# Patient Record
Sex: Female | Born: 1985 | Race: Black or African American | Hispanic: No | Marital: Single | Smoking: Former smoker
Health system: Southern US, Community
[De-identification: ages and names within clinical notes are randomized; demographics above are authoritative.]

## PROBLEM LIST (undated history)

## (undated) ENCOUNTER — Inpatient Hospital Stay (HOSPITAL_COMMUNITY): Payer: Self-pay

## (undated) DIAGNOSIS — Z789 Other specified health status: Secondary | ICD-10-CM

## (undated) HISTORY — PX: NO PAST SURGERIES: SHX2092

---
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
## 2005-06-11 ENCOUNTER — Emergency Department (HOSPITAL_COMMUNITY): Admission: EM | Admit: 2005-06-11 | Discharge: 2005-06-11 | Payer: Self-pay | Admitting: Emergency Medicine

## 2010-05-27 ENCOUNTER — Inpatient Hospital Stay (INDEPENDENT_AMBULATORY_CARE_PROVIDER_SITE_OTHER)
Admission: RE | Admit: 2010-05-27 | Discharge: 2010-05-27 | Disposition: A | Discharge status: Home / Self Care | Payer: Self-pay | Source: Ambulatory Visit | Attending: Family Medicine | Admitting: Family Medicine

## 2010-05-27 DIAGNOSIS — K625 Hemorrhage of anus and rectum: Secondary | ICD-10-CM

## 2011-03-31 ENCOUNTER — Other Ambulatory Visit: Payer: Self-pay

## 2011-03-31 LAB — OB RESULTS CONSOLE HIV ANTIBODY (ROUTINE TESTING): HIV: NONREACTIVE

## 2011-03-31 LAB — OB RESULTS CONSOLE HEPATITIS B SURFACE ANTIGEN: Hepatitis B Surface Ag: NEGATIVE

## 2011-03-31 LAB — OB RESULTS CONSOLE RPR: RPR: NONREACTIVE

## 2011-06-25 LAB — OB RESULTS CONSOLE GC/CHLAMYDIA: Chlamydia: NEGATIVE

## 2011-07-15 ENCOUNTER — Other Ambulatory Visit: Payer: Self-pay | Admitting: Nurse Practitioner

## 2011-07-15 DIAGNOSIS — O09299 Supervision of pregnancy with other poor reproductive or obstetric history, unspecified trimester: Secondary | ICD-10-CM

## 2011-07-23 ENCOUNTER — Ambulatory Visit (HOSPITAL_COMMUNITY)
Admission: RE | Admit: 2011-07-23 | Discharge: 2011-07-23 | Disposition: A | Discharge status: Home / Self Care | Payer: Medicaid Other | Source: Ambulatory Visit | Attending: Nurse Practitioner | Admitting: Nurse Practitioner

## 2011-07-23 ENCOUNTER — Other Ambulatory Visit: Payer: Self-pay | Admitting: Nurse Practitioner

## 2011-07-23 ENCOUNTER — Other Ambulatory Visit (HOSPITAL_COMMUNITY): Payer: Self-pay | Admitting: Maternal and Fetal Medicine

## 2011-07-23 ENCOUNTER — Encounter (HOSPITAL_COMMUNITY): Payer: Self-pay

## 2011-07-23 DIAGNOSIS — O09299 Supervision of pregnancy with other poor reproductive or obstetric history, unspecified trimester: Secondary | ICD-10-CM | POA: Insufficient documentation

## 2011-07-23 DIAGNOSIS — O26849 Uterine size-date discrepancy, unspecified trimester: Secondary | ICD-10-CM

## 2011-07-23 DIAGNOSIS — O365990 Maternal care for other known or suspected poor fetal growth, unspecified trimester, not applicable or unspecified: Secondary | ICD-10-CM | POA: Insufficient documentation

## 2011-07-23 DIAGNOSIS — IMO0002 Reserved for concepts with insufficient information to code with codable children: Secondary | ICD-10-CM

## 2011-07-23 DIAGNOSIS — O358XX0 Maternal care for other (suspected) fetal abnormality and damage, not applicable or unspecified: Secondary | ICD-10-CM | POA: Insufficient documentation

## 2011-07-23 DIAGNOSIS — Z363 Encounter for antenatal screening for malformations: Secondary | ICD-10-CM | POA: Insufficient documentation

## 2011-07-23 DIAGNOSIS — Z1389 Encounter for screening for other disorder: Secondary | ICD-10-CM | POA: Insufficient documentation

## 2011-07-23 NOTE — Progress Notes (Signed)
MFM note  Shari Hardin is a 26 yo G2P0100 currently at 73 5/7 weeks who is seen for detailed ultrasound and consultation due to history of twin IUFD at 33 weeks.  She reports that her last pregnancy was with ? Dichorionic/diamniotic twins that had been uncomplicated.  At 33 weeks, she felt decreased fetal movement and was seen on L&D and diagnosed with an intrauterine fetal demise of both fetuses.  She did not undergo autopsy and placental pathology was unavailable for review at the time of consultation.  The cause of the twin demise is unknown.  Her current pregnancy has otherwise been uncomplicated.  PMH - neg  PSH - neg  Meds: Claritin             Prenatal vitamins  Social - patient is a non drinker and non smoker, denies illicit drug use  Family hx: father with Diabetes, hypertension; mother with hypertension  Ultrasound:  Single IUP at 77 5/7 weeks Normal anatomic fetal survey The estimated fetal weight today is at the 23rd percentile; however the [facility] and [facility] are at the 3rd and 5th percentile respectively Normal umbilical artery Doppler studies Normal amniotic fluid volume  Recommend weekly Dopplers / BPPs Follow up in 3 weeks for interval growth NSTs beginning at 32 weeks.  Impression/ Plan: 1) Hx of 33 week twin demise - unknown etiology.  Would consider induction of labor at 39 weeks if delivery is not indicated prior to that gestation. 2) AC measuring < 3rd percentile - overall fetal growth appropriate.  Will begin weekly umbilical artery Dopplers/ BPPs - plan NSTs beginning at 32 weeks and interval growth scans every 3-4 weeks.  Thank you for the opportunity to be a part of the care of Shari Hardin. Please contact our office if we can be of further assistance.   I spent approximately 30 minutes with this patient with over 50% of time spent in face-to-face counseling.

## 2011-07-29 ENCOUNTER — Encounter (HOSPITAL_COMMUNITY): Payer: Self-pay

## 2011-07-29 ENCOUNTER — Ambulatory Visit (HOSPITAL_COMMUNITY)
Admission: RE | Admit: 2011-07-29 | Discharge: 2011-07-29 | Disposition: A | Discharge status: Home / Self Care | Payer: Medicaid Other | Source: Ambulatory Visit | Attending: Nurse Practitioner | Admitting: Nurse Practitioner

## 2011-07-29 ENCOUNTER — Other Ambulatory Visit (HOSPITAL_COMMUNITY): Payer: Self-pay | Admitting: Maternal and Fetal Medicine

## 2011-07-29 VITALS — BP 103/63 | HR 108 | Wt 175.0 lb

## 2011-07-29 DIAGNOSIS — O09299 Supervision of pregnancy with other poor reproductive or obstetric history, unspecified trimester: Secondary | ICD-10-CM | POA: Insufficient documentation

## 2011-07-29 DIAGNOSIS — IMO0002 Reserved for concepts with insufficient information to code with codable children: Secondary | ICD-10-CM

## 2011-07-29 DIAGNOSIS — O365990 Maternal care for other known or suspected poor fetal growth, unspecified trimester, not applicable or unspecified: Secondary | ICD-10-CM | POA: Insufficient documentation

## 2011-07-29 NOTE — Progress Notes (Signed)
Patient seen for follow up BPP and Doppler studies.  See report in [facility].  Alpha Gula, MD  Single IUP at 30 4/7 weeks The fetus is active with a BPP of 8/8 Normal umbilical artery Doppler studies  Recommend weekly Dopplers / BPPs Follow up in 2 weeks for interval growth NSTs beginning at 32 weeks.

## 2011-08-05 ENCOUNTER — Encounter (HOSPITAL_COMMUNITY): Payer: Self-pay

## 2011-08-05 ENCOUNTER — Ambulatory Visit (HOSPITAL_COMMUNITY)
Admission: RE | Admit: 2011-08-05 | Discharge: 2011-08-05 | Disposition: A | Discharge status: Home / Self Care | Payer: Medicaid Other | Source: Ambulatory Visit | Attending: Nurse Practitioner | Admitting: Nurse Practitioner

## 2011-08-05 VITALS — BP 98/60 | HR 97 | Wt 174.0 lb

## 2011-08-05 DIAGNOSIS — O09299 Supervision of pregnancy with other poor reproductive or obstetric history, unspecified trimester: Secondary | ICD-10-CM | POA: Insufficient documentation

## 2011-08-05 DIAGNOSIS — IMO0002 Reserved for concepts with insufficient information to code with codable children: Secondary | ICD-10-CM

## 2011-08-05 DIAGNOSIS — O365990 Maternal care for other known or suspected poor fetal growth, unspecified trimester, not applicable or unspecified: Secondary | ICD-10-CM | POA: Insufficient documentation

## 2011-08-05 NOTE — Progress Notes (Signed)
Patient seen for follow up BPP and Doppler studies.  See report in [facility].  Alpha Gula, MD  Single IUP at 30 4/7 weeks The fetus is active with a BPP of 8/8 Normal umbilical artery Doppler studies without evidence of AEDF or reversed diastolic flow Normal amniotic fluid volume  Recommend weekly Dopplers / BPPs Follow up next week for interval growth NSTs beginning at 32 weeks.

## 2011-08-12 ENCOUNTER — Ambulatory Visit (HOSPITAL_COMMUNITY)
Admission: RE | Admit: 2011-08-12 | Discharge: 2011-08-12 | Disposition: A | Discharge status: Home / Self Care | Payer: Medicaid Other | Source: Ambulatory Visit | Attending: Nurse Practitioner | Admitting: Nurse Practitioner

## 2011-08-12 ENCOUNTER — Encounter (HOSPITAL_COMMUNITY): Payer: Self-pay

## 2011-08-12 VITALS — BP 101/55 | HR 90 | Wt 176.0 lb

## 2011-08-12 DIAGNOSIS — O09299 Supervision of pregnancy with other poor reproductive or obstetric history, unspecified trimester: Secondary | ICD-10-CM | POA: Insufficient documentation

## 2011-08-12 DIAGNOSIS — IMO0002 Reserved for concepts with insufficient information to code with codable children: Secondary | ICD-10-CM

## 2011-08-12 DIAGNOSIS — O365990 Maternal care for other known or suspected poor fetal growth, unspecified trimester, not applicable or unspecified: Secondary | ICD-10-CM | POA: Insufficient documentation

## 2011-08-12 NOTE — Progress Notes (Signed)
Patient seen today  for follow up ultrasound.  See full report in [facility].  Alpha Gula, MD  Single IUP at 32 4/7 weeks Normal anatomic fetal survey The estimated fetal weight today is at the 29th percentile; the  [facility] is now measuring at the 14th percentile The umbilical artery S/D ratios are elevated but there is no evidence of absent or reversed diastolic flow Normal amniotic fluid volume  Recommend continued weekly BPP / Doppler studies at this office.  Also recommend beginning weekly NSTs. Follow up growth scan in 4 weeks.

## 2011-08-13 ENCOUNTER — Other Ambulatory Visit (HOSPITAL_COMMUNITY): Payer: Self-pay | Admitting: Maternal and Fetal Medicine

## 2011-08-13 DIAGNOSIS — IMO0002 Reserved for concepts with insufficient information to code with codable children: Secondary | ICD-10-CM

## 2011-08-17 ENCOUNTER — Encounter (HOSPITAL_COMMUNITY): Payer: Self-pay

## 2011-08-17 ENCOUNTER — Ambulatory Visit (HOSPITAL_COMMUNITY)
Admission: RE | Admit: 2011-08-17 | Discharge: 2011-08-17 | Disposition: A | Discharge status: Home / Self Care | Payer: Medicaid Other | Source: Ambulatory Visit | Attending: Nurse Practitioner | Admitting: Nurse Practitioner

## 2011-08-17 ENCOUNTER — Other Ambulatory Visit (HOSPITAL_COMMUNITY): Payer: Self-pay | Admitting: Maternal and Fetal Medicine

## 2011-08-17 VITALS — BP 108/53 | HR 103 | Wt 176.5 lb

## 2011-08-17 DIAGNOSIS — IMO0002 Reserved for concepts with insufficient information to code with codable children: Secondary | ICD-10-CM

## 2011-08-17 DIAGNOSIS — O365990 Maternal care for other known or suspected poor fetal growth, unspecified trimester, not applicable or unspecified: Secondary | ICD-10-CM | POA: Insufficient documentation

## 2011-08-17 DIAGNOSIS — O09299 Supervision of pregnancy with other poor reproductive or obstetric history, unspecified trimester: Secondary | ICD-10-CM | POA: Insufficient documentation

## 2011-08-17 NOTE — Progress Notes (Signed)
Patient seen for follow up Doppler studies.  See report in [facility].  Alpha Gula, MD  Single IUP at 33 2/7 weeks Normal amniotic fluid volume Umbilical artery Dopplers were within normal limits without evidence of AEDF or reversed flow.  Recommend continued weekly BPP / Doppler studies at this office.  Also recommend beginning weekly NSTs. Follow up growth scan in 4 weeks.

## 2011-08-24 ENCOUNTER — Ambulatory Visit (HOSPITAL_COMMUNITY)
Admission: RE | Admit: 2011-08-24 | Discharge: 2011-08-24 | Disposition: A | Discharge status: Home / Self Care | Payer: Medicaid Other | Source: Ambulatory Visit | Attending: Nurse Practitioner | Admitting: Nurse Practitioner

## 2011-08-24 VITALS — BP 103/57 | HR 98 | Wt 177.2 lb

## 2011-08-24 DIAGNOSIS — O09299 Supervision of pregnancy with other poor reproductive or obstetric history, unspecified trimester: Secondary | ICD-10-CM | POA: Insufficient documentation

## 2011-08-24 DIAGNOSIS — O365990 Maternal care for other known or suspected poor fetal growth, unspecified trimester, not applicable or unspecified: Secondary | ICD-10-CM | POA: Insufficient documentation

## 2011-08-24 DIAGNOSIS — IMO0002 Reserved for concepts with insufficient information to code with codable children: Secondary | ICD-10-CM

## 2011-08-27 LAB — OB RESULTS CONSOLE GBS: GBS: NEGATIVE

## 2011-08-31 ENCOUNTER — Other Ambulatory Visit (HOSPITAL_COMMUNITY): Payer: Self-pay | Admitting: Maternal and Fetal Medicine

## 2011-08-31 ENCOUNTER — Encounter (HOSPITAL_COMMUNITY): Payer: Self-pay

## 2011-08-31 ENCOUNTER — Ambulatory Visit (HOSPITAL_COMMUNITY)
Admission: RE | Admit: 2011-08-31 | Discharge: 2011-08-31 | Disposition: A | Discharge status: Home / Self Care | Payer: Medicaid Other | Source: Ambulatory Visit | Attending: Nurse Practitioner | Admitting: Nurse Practitioner

## 2011-08-31 VITALS — BP 102/59 | HR 93 | Wt 177.0 lb

## 2011-08-31 DIAGNOSIS — IMO0002 Reserved for concepts with insufficient information to code with codable children: Secondary | ICD-10-CM

## 2011-08-31 DIAGNOSIS — O365990 Maternal care for other known or suspected poor fetal growth, unspecified trimester, not applicable or unspecified: Secondary | ICD-10-CM | POA: Insufficient documentation

## 2011-08-31 DIAGNOSIS — O09299 Supervision of pregnancy with other poor reproductive or obstetric history, unspecified trimester: Secondary | ICD-10-CM | POA: Insufficient documentation

## 2011-08-31 NOTE — Progress Notes (Signed)
Patient seen today  for follow up ultrasound.  See full report in [facility].  Alpha Gula, MD  Single IUP at 35 2/7 weeks The estimated fetal weight today is at the 20th percentile; the [facility] is now measuring at the 7th percentile (asymmetric growth pattern) Normal umbilical artery Doppler studies without evidence of AEDF or reversed flow Normal amniotic fluid volume The fetus is active with a BPP of 8/8  Recommend continued weekly BPP / Doppler studies at this office.  Also recommend beginning weekly NSTs. Would recommend delivery at 39 weeks if undelivered.

## 2011-09-02 ENCOUNTER — Inpatient Hospital Stay (HOSPITAL_COMMUNITY)
Admission: AD | Admit: 2011-09-02 | Discharge: 2011-09-02 | Disposition: A | Discharge status: Home / Self Care | Payer: Medicaid Other | Source: Ambulatory Visit | Attending: Obstetrics & Gynecology | Admitting: Obstetrics & Gynecology

## 2011-09-02 ENCOUNTER — Encounter (HOSPITAL_COMMUNITY): Payer: Self-pay | Admitting: *Deleted

## 2011-09-02 DIAGNOSIS — O4700 False labor before 37 completed weeks of gestation, unspecified trimester: Secondary | ICD-10-CM | POA: Insufficient documentation

## 2011-09-02 DIAGNOSIS — O368190 Decreased fetal movements, unspecified trimester, not applicable or unspecified: Secondary | ICD-10-CM | POA: Insufficient documentation

## 2011-09-02 HISTORY — DX: Other specified health status: Z78.9

## 2011-09-02 NOTE — MAU Note (Signed)
Pt states she took a nap and did not feel like she was feeling the baby move,pt states this lasted for about 3 hours

## 2011-09-02 NOTE — MAU Note (Signed)
Patient stats she has been feeling some fetal movement today but not as much as usual. Denies any bleeding or leaking and states she has had one contraction. Fetal heart tones in triage in the 140's with an audible acceleration.

## 2011-09-02 NOTE — MAU Provider Note (Signed)
  History     CSN: [id number]  Arrival date and time: 09/02/11 1715   None     Chief Complaint  Patient presents with  . Decreased Fetal Movement   HPI Shari Hardin 25 y.o. [redacted]w[redacted]d Client comes to MAU today due to not feeling the baby move.  Is high risk due to loss of twins at 33 weeks.  Was in the office today and then took a nap.  Was not feeling the baby move so came to MAU for evaluation.  Next appointment is Monday.  Having irregular contractions.  OB History    Grav Para Term Preterm Abortions TAB SAB Ect Mult Living   2 1 0 1 0 0 0 0 1 0       Past Medical History  Diagnosis Date  . No pertinent past medical history     Past Surgical History  Procedure Date  . No past surgeries     Family History  Problem Relation Age of Onset  . Hypertension Mother   . Diabetes Father   . Hypertension Father     History  Substance Use Topics  . Smoking status: Former Smoker    Quit date: 02/02/2011  . Smokeless tobacco: Not on file  . Alcohol Use: No    Allergies: No Known Allergies  Prescriptions prior to admission  Medication Sig Dispense Refill  . loratadine (CLARITIN) 10 MG tablet Take 10 mg by mouth daily. Pt has not taken yet.  Has not picked up prescription. pollin allergy      . Prenatal Vit-Fe Fumarate-FA (PRENATAL MULTIVITAMIN) TABS Take 1 tablet by mouth daily.        Review of Systems  Gastrointestinal: Negative for nausea, vomiting and abdominal pain.  Genitourinary: Negative for dysuria.       Decreased fetal movement   Physical Exam   Blood pressure 107/59, pulse 77, temperature 98.1 F (36.7 C), temperature source Oral, resp. rate 16, height 5\' 5"  (1.651 m), weight 178 lb 3.2 oz (80.831 kg), last menstrual period 12/27/2010, SpO2 100.00%.  Physical Exam  Nursing note and vitals reviewed. Constitutional: She is oriented to person, place, and time. She appears well-developed and well-nourished.  HENT:  Head: Normocephalic.  Eyes: EOM are  normal.  Neck: Neck supple.  GI: Soft. There is no tenderness.       FHT baseline 150.  Reactive strip.   Irregular contractions.  Cervix closed - checked by RN.   Client now feeling movement.  Musculoskeletal: Normal range of motion.  Neurological: She is alert and oriented to person, place, and time.  Skin: Skin is warm and dry.  Psychiatric: She has a normal mood and affect.    MAU Course  Procedures  MDM Reactive NST and baby now moving.  Client wants to go home.  Assessment and Plan  Decreased fetal movement was client's initial complaint Currently baby is moving - NST Reactive  Plan Drink at least 8 8-oz glasses of water every day. Keep you appointments as scheduled Call your doctor if you have questions or concerns.  [name] 09/02/2011, 8:38 PM

## 2011-09-07 ENCOUNTER — Ambulatory Visit (HOSPITAL_COMMUNITY)
Admission: RE | Admit: 2011-09-07 | Discharge: 2011-09-07 | Disposition: A | Discharge status: Home / Self Care | Payer: Medicaid Other | Source: Ambulatory Visit | Attending: Nurse Practitioner | Admitting: Nurse Practitioner

## 2011-09-07 ENCOUNTER — Encounter (HOSPITAL_COMMUNITY): Payer: Self-pay

## 2011-09-07 VITALS — BP 100/56 | HR 88 | Wt 179.0 lb

## 2011-09-07 DIAGNOSIS — O09299 Supervision of pregnancy with other poor reproductive or obstetric history, unspecified trimester: Secondary | ICD-10-CM | POA: Insufficient documentation

## 2011-09-07 DIAGNOSIS — O365990 Maternal care for other known or suspected poor fetal growth, unspecified trimester, not applicable or unspecified: Secondary | ICD-10-CM | POA: Insufficient documentation

## 2011-09-07 DIAGNOSIS — IMO0002 Reserved for concepts with insufficient information to code with codable children: Secondary | ICD-10-CM

## 2011-09-07 NOTE — Progress Notes (Signed)
Patient seen for follow up BPP and Doppler studies.  See report in [facility].  Alpha Gula, MD  Single IUP at 36 2/7 weeks Asymmetric growth pattern on previous ultrasound (AC at the 7th %tile, overall growth at the 20th %tile) Normal umbilical artery Doppler studies without evidence of AEDF or reversed flow Normal amniotic fluid volume The fetus is active with a BPP of 8/8  Recommend continued weekly BPP / Doppler studies at this office.  Also recommend beginning weekly NSTs. Would recommend delivery by 39 weeks

## 2011-09-17 ENCOUNTER — Ambulatory Visit (HOSPITAL_COMMUNITY)
Admission: RE | Admit: 2011-09-17 | Discharge: 2011-09-17 | Disposition: A | Discharge status: Home / Self Care | Payer: Medicaid Other | Source: Ambulatory Visit | Attending: Nurse Practitioner | Admitting: Nurse Practitioner

## 2011-09-17 VITALS — BP 103/63 | HR 88 | Wt 180.0 lb

## 2011-09-17 DIAGNOSIS — IMO0002 Reserved for concepts with insufficient information to code with codable children: Secondary | ICD-10-CM

## 2011-09-17 DIAGNOSIS — O09299 Supervision of pregnancy with other poor reproductive or obstetric history, unspecified trimester: Secondary | ICD-10-CM | POA: Insufficient documentation

## 2011-09-17 DIAGNOSIS — O365990 Maternal care for other known or suspected poor fetal growth, unspecified trimester, not applicable or unspecified: Secondary | ICD-10-CM | POA: Insufficient documentation

## 2011-09-23 ENCOUNTER — Other Ambulatory Visit: Payer: Self-pay | Admitting: Obstetrics

## 2011-09-24 ENCOUNTER — Encounter (HOSPITAL_COMMUNITY): Payer: Self-pay | Admitting: *Deleted

## 2011-09-24 ENCOUNTER — Telehealth (HOSPITAL_COMMUNITY): Payer: Self-pay | Admitting: *Deleted

## 2011-09-24 ENCOUNTER — Ambulatory Visit (HOSPITAL_COMMUNITY)
Admission: RE | Admit: 2011-09-24 | Discharge: 2011-09-24 | Disposition: A | Discharge status: Home / Self Care | Payer: Medicaid Other | Source: Ambulatory Visit | Attending: Nurse Practitioner | Admitting: Nurse Practitioner

## 2011-09-24 DIAGNOSIS — O09299 Supervision of pregnancy with other poor reproductive or obstetric history, unspecified trimester: Secondary | ICD-10-CM | POA: Insufficient documentation

## 2011-09-24 DIAGNOSIS — IMO0002 Reserved for concepts with insufficient information to code with codable children: Secondary | ICD-10-CM

## 2011-09-24 DIAGNOSIS — O365990 Maternal care for other known or suspected poor fetal growth, unspecified trimester, not applicable or unspecified: Secondary | ICD-10-CM | POA: Insufficient documentation

## 2011-09-24 NOTE — Telephone Encounter (Signed)
Preadmission screen  

## 2011-09-28 ENCOUNTER — Inpatient Hospital Stay (HOSPITAL_COMMUNITY)
Admission: RE | Admit: 2011-09-28 | Discharge: 2011-10-03 | DRG: 765 | Disposition: A | Discharge status: Home / Self Care | Payer: Medicaid Other | Source: Ambulatory Visit | Attending: Obstetrics | Admitting: Obstetrics

## 2011-09-28 ENCOUNTER — Encounter (HOSPITAL_COMMUNITY): Payer: Self-pay

## 2011-09-28 DIAGNOSIS — O324XX0 Maternal care for high head at term, not applicable or unspecified: Secondary | ICD-10-CM | POA: Diagnosis present

## 2011-09-28 DIAGNOSIS — Z98891 History of uterine scar from previous surgery: Secondary | ICD-10-CM | POA: Diagnosis not present

## 2011-09-28 DIAGNOSIS — O365990 Maternal care for other known or suspected poor fetal growth, unspecified trimester, not applicable or unspecified: Secondary | ICD-10-CM | POA: Diagnosis present

## 2011-09-28 LAB — CBC
Hemoglobin: 10.7 g/dL — ABNORMAL LOW (ref 12.0–15.0)
MCH: 35.3 pg — ABNORMAL HIGH (ref 26.0–34.0)
MCV: 102.3 fL — ABNORMAL HIGH (ref 78.0–100.0)
Platelets: 191 10*3/uL (ref 150–400)
RBC: 3.03 MIL/uL — ABNORMAL LOW (ref 3.87–5.11)
WBC: 15 10*3/uL — ABNORMAL HIGH (ref 4.0–10.5)

## 2011-09-28 MED ORDER — MISOPROSTOL 25 MCG QUARTER TABLET
25.0000 ug | ORAL_TABLET | ORAL | Status: DC | PRN
  Administered 2011-09-28 – 2011-09-29 (×3): 25 ug via VAGINAL
  Filled 2011-09-28 (×3): qty 0.25

## 2011-09-28 MED ORDER — LACTATED RINGERS IV SOLN
500.0000 mL | INTRAVENOUS | Status: DC | PRN
  Administered 2011-09-30 (×2): 500 mL via INTRAVENOUS

## 2011-09-28 MED ORDER — HYDROXYZINE HCL 50 MG PO TABS: 50.0000 mg | ORAL_TABLET | Freq: Four times a day (QID) | ORAL | Status: DC | PRN

## 2011-09-28 MED ORDER — BUTORPHANOL TARTRATE 1 MG/ML IJ SOLN
1.0000 mg | INTRAMUSCULAR | Status: DC | PRN
  Administered 2011-09-29 (×3): 1 mg via INTRAVENOUS
  Filled 2011-09-28 (×3): qty 1

## 2011-09-28 MED ORDER — IBUPROFEN 600 MG PO TABS: 600.0000 mg | ORAL_TABLET | Freq: Four times a day (QID) | ORAL | Status: DC | PRN

## 2011-09-28 MED ORDER — OXYTOCIN 40 UNITS IN LACTATED RINGERS INFUSION - SIMPLE MED: 62.5000 mL/h | Freq: Once | INTRAVENOUS | Status: DC

## 2011-09-28 MED ORDER — OXYTOCIN BOLUS FROM INFUSION
250.0000 mL | Freq: Once | INTRAVENOUS | Status: DC
  Filled 2011-09-28: qty 500

## 2011-09-28 MED ORDER — ACETAMINOPHEN 325 MG PO TABS: 650.0000 mg | ORAL_TABLET | ORAL | Status: DC | PRN

## 2011-09-28 MED ORDER — CITRIC ACID-SODIUM CITRATE 334-500 MG/5ML PO SOLN
30.0000 mL | ORAL | Status: DC | PRN
  Administered 2011-09-30: 30 mL via ORAL
  Filled 2011-09-28: qty 15

## 2011-09-28 MED ORDER — BUTORPHANOL TARTRATE 1 MG/ML IJ SOLN: 1.0000 mg | INTRAMUSCULAR | Status: DC | PRN

## 2011-09-28 MED ORDER — TERBUTALINE SULFATE 1 MG/ML IJ SOLN: 0.2500 mg | Freq: Once | INTRAMUSCULAR | Status: AC | PRN

## 2011-09-28 MED ORDER — LACTATED RINGERS IV SOLN
INTRAVENOUS | Status: DC
  Administered 2011-09-28 – 2011-09-30 (×5): via INTRAVENOUS

## 2011-09-28 MED ORDER — LIDOCAINE HCL (PF) 1 % IJ SOLN: 30.0000 mL | INTRAMUSCULAR | Status: DC | PRN

## 2011-09-28 MED ORDER — ONDANSETRON HCL 4 MG/2ML IJ SOLN
4.0000 mg | Freq: Four times a day (QID) | INTRAMUSCULAR | Status: DC | PRN
  Administered 2011-09-30: 4 mg via INTRAVENOUS
  Filled 2011-09-28: qty 2

## 2011-09-28 MED ORDER — OXYCODONE-ACETAMINOPHEN 5-325 MG PO TABS: 1.0000 | ORAL_TABLET | ORAL | Status: DC | PRN

## 2011-09-28 MED ORDER — HYDROXYZINE HCL 50 MG/ML IM SOLN: 50.0000 mg | Freq: Four times a day (QID) | INTRAMUSCULAR | Status: DC | PRN

## 2011-09-28 MED ORDER — PROMETHAZINE HCL 25 MG/ML IJ SOLN
12.5000 mg | INTRAMUSCULAR | Status: DC | PRN
  Administered 2011-09-29 (×2): 12.5 mg via INTRAVENOUS
  Filled 2011-09-28 (×2): qty 1

## 2011-09-29 ENCOUNTER — Encounter (HOSPITAL_COMMUNITY): Payer: Self-pay

## 2011-09-29 MED ORDER — EPHEDRINE 5 MG/ML INJ
10.0000 mg | INTRAVENOUS | Status: DC | PRN
  Filled 2011-09-29: qty 4

## 2011-09-29 MED ORDER — OXYTOCIN 40 UNITS IN LACTATED RINGERS INFUSION - SIMPLE MED
1.0000 m[IU]/min | INTRAVENOUS | Status: DC
  Administered 2011-09-29: 1 m[IU]/min via INTRAVENOUS
  Filled 2011-09-29: qty 1000

## 2011-09-29 MED ORDER — FENTANYL 2.5 MCG/ML BUPIVACAINE 1/10 % EPIDURAL INFUSION (WH - ANES)
14.0000 mL/h | INTRAMUSCULAR | Status: DC
  Administered 2011-09-30 (×4): 14 mL/h via EPIDURAL
  Filled 2011-09-29 (×5): qty 60

## 2011-09-29 MED ORDER — TERBUTALINE SULFATE 1 MG/ML IJ SOLN: 0.2500 mg | Freq: Once | INTRAMUSCULAR | Status: AC | PRN

## 2011-09-29 MED ORDER — PHENYLEPHRINE 40 MCG/ML (10ML) SYRINGE FOR IV PUSH (FOR BLOOD PRESSURE SUPPORT): 80.0000 ug | PREFILLED_SYRINGE | INTRAVENOUS | Status: DC | PRN

## 2011-09-29 MED ORDER — DIPHENHYDRAMINE HCL 50 MG/ML IJ SOLN: 12.5000 mg | INTRAMUSCULAR | Status: DC | PRN

## 2011-09-29 MED ORDER — EPHEDRINE 5 MG/ML INJ: 10.0000 mg | INTRAVENOUS | Status: DC | PRN

## 2011-09-29 MED ORDER — LACTATED RINGERS IV SOLN
500.0000 mL | Freq: Once | INTRAVENOUS | Status: AC
  Administered 2011-09-30: 500 mL via INTRAVENOUS

## 2011-09-29 MED ORDER — PHENYLEPHRINE 40 MCG/ML (10ML) SYRINGE FOR IV PUSH (FOR BLOOD PRESSURE SUPPORT)
80.0000 ug | PREFILLED_SYRINGE | INTRAVENOUS | Status: DC | PRN
  Filled 2011-09-29: qty 5

## 2011-09-29 NOTE — H&P (Signed)
Shari Hardin is a 26 y.o. female presenting for IOL. Maternal Medical History:  Reason for admission: 26 yo G2 P0   EDC 10-03-11.  Presents for IOL for asymmetric growth restriction with AC at 7th percentile.  Fetal activity: Perceived fetal activity is normal.   Last perceived fetal movement was within the past hour.    Prenatal complications: no prenatal complications   OB History    Grav Para Term Preterm Abortions TAB SAB Ect Mult Living   2 1 0 1 0 0 0 0 1 0      Past Medical History  Diagnosis Date  . No pertinent past medical history    Past Surgical History  Procedure Date  . No past surgeries    Family History: family history includes Diabetes in her father and Hypertension in her father and mother. Social History:  reports that she quit smoking about 7 months ago. She has never used smokeless tobacco. She reports that she does not drink alcohol or use illicit drugs.   Prenatal Transfer Tool  Maternal Diabetes: No Genetic Screening: Normal Maternal Ultrasounds/Referrals: Normal Fetal Ultrasounds or other Referrals:  Referred to Materal Fetal Medicine , Other: see prenatal record Maternal Substance Abuse:  No Significant Maternal Medications:  Meds include: Other: see prenatal record Significant Maternal Lab Results:  Lab values include: Group B Strep negative Other Comments:  Asymmetric IUGR.  Review of Systems  All other systems reviewed and are negative.    Dilation: 1 Effacement (%): 50 Station: -3 Exam by:: N Deal RN Blood pressure 116/72, pulse 77, temperature 97.8 F (36.6 C), temperature source Oral, resp. rate 16, height 5\' 5"  (1.651 m), weight 81.647 kg (180 lb), last menstrual period 12/27/2010. Maternal Exam:  Abdomen: Patient reports no abdominal tenderness. Introitus: Normal vulva. Normal vagina.    Physical Exam  Nursing note and vitals reviewed. Constitutional: She is oriented to person, place, and time. She appears well-developed and  well-nourished.  HENT:  Head: Normocephalic and atraumatic.  Eyes: Conjunctivae are normal. Pupils are equal, round, and reactive to light.  Neck: Normal range of motion. Neck supple.  Cardiovascular: Normal rate and regular rhythm.   Respiratory: Effort normal.  GI: Soft.  Genitourinary: Vagina normal and uterus normal.  Musculoskeletal: Normal range of motion.  Neurological: She is alert and oriented to person, place, and time.  Skin: Skin is warm and dry.  Psychiatric: She has a normal mood and affect. Her behavior is normal. Judgment and thought content normal.    Prenatal labs: ABO, Rh: A/Positive/-- (01/29 0000) Antibody: Negative (01/29 0000) Rubella: Immune (01/29 0000) RPR: NON REACTIVE (07/29 1010)  HBsAg: Negative (01/29 0000)  HIV: Non-reactive (01/29 0000)  GBS: Negative (06/27 0000)   Assessment/Plan: 39.3 weeks.  Asymmetric IUGR.  Admitted for IOL, per recommendation of MFM.   HARPER,CHARLES A 09/29/2011, 8:19 AM

## 2011-09-29 NOTE — Progress Notes (Signed)
Roshana B [place] is a 26 y.o. G2P0100 at [redacted]w[redacted]d by LMP admitted for induction of labor due to Poor fetal growth.  Subjective:   Objective: BP 116/72  Pulse 77  Temp 97.8 F (36.6 C) (Oral)  Resp 16  Ht 5\' 5"  (1.651 m)  Wt 81.647 kg (180 lb)  BMI 29.95 kg/m2  LMP 12/27/2010      FHT:  FHR: 150 bpm, variability: moderate,  accelerations:  Present,  decelerations:  Absent UC:   regular, every 6 minutes SVE:   Dilation: 1 Effacement (%): 50 Station: -3 Exam by:: N Deal RN  Labs: Lab Results  Component Value Date   WBC 15.0* 09/28/2011   HGB 10.7* 09/28/2011   HCT 31.0* 09/28/2011   MCV 102.3* 09/28/2011   PLT 191 09/28/2011    Assessment / Plan: 39.3 weeks.  Asymmetric IUGR.  2 stage IOL.  Labor: Latent phase. Preeclampsia:  n/a Fetal Wellbeing:  Category I Pain Control:  Nubain I/D:  n/a Anticipated MOD:  NSVD  HARPER,CHARLES A 09/29/2011, 8:28 AM

## 2011-09-29 NOTE — Progress Notes (Signed)
Sleeping through contractions

## 2011-09-30 ENCOUNTER — Encounter (HOSPITAL_COMMUNITY): Payer: Self-pay | Admitting: Anesthesiology

## 2011-09-30 ENCOUNTER — Inpatient Hospital Stay (HOSPITAL_COMMUNITY): Payer: Medicaid Other | Admitting: Anesthesiology

## 2011-09-30 ENCOUNTER — Encounter (HOSPITAL_COMMUNITY): Payer: Self-pay

## 2011-09-30 ENCOUNTER — Encounter (HOSPITAL_COMMUNITY)
Admission: RE | Disposition: A | Discharge status: Home / Self Care | Payer: Self-pay | Source: Ambulatory Visit | Attending: Obstetrics

## 2011-09-30 LAB — HEMOGLOBIN AND HEMATOCRIT, BLOOD
HCT: 24.5 % — ABNORMAL LOW (ref 36.0–46.0)
Hemoglobin: 8.4 g/dL — ABNORMAL LOW (ref 12.0–15.0)

## 2011-09-30 SURGERY — Surgical Case
Anesthesia: Regional | Site: Abdomen | Wound class: Clean Contaminated

## 2011-09-30 MED ORDER — PHENYLEPHRINE HCL 10 MG/ML IJ SOLN
INTRAMUSCULAR | Status: DC | PRN
  Administered 2011-09-30: 80 ug via INTRAVENOUS
  Administered 2011-09-30: 40 ug via INTRAVENOUS
  Administered 2011-09-30 (×3): 80 ug via INTRAVENOUS

## 2011-09-30 MED ORDER — MEPERIDINE HCL 25 MG/ML IJ SOLN: 6.2500 mg | INTRAMUSCULAR | Status: DC | PRN

## 2011-09-30 MED ORDER — MORPHINE SULFATE (PF) 0.5 MG/ML IJ SOLN
INTRAMUSCULAR | Status: DC | PRN
  Administered 2011-09-30: 3 ug via EPIDURAL

## 2011-09-30 MED ORDER — HYDROMORPHONE HCL PF 1 MG/ML IJ SOLN
0.2500 mg | INTRAMUSCULAR | Status: DC | PRN
  Administered 2011-09-30: 0.5 mg via INTRAVENOUS

## 2011-09-30 MED ORDER — SCOPOLAMINE 1 MG/3DAYS TD PT72
1.0000 | MEDICATED_PATCH | Freq: Once | TRANSDERMAL | Status: DC
  Administered 2011-09-30: 1.5 mg via TRANSDERMAL

## 2011-09-30 MED ORDER — PHENYLEPHRINE 40 MCG/ML (10ML) SYRINGE FOR IV PUSH (FOR BLOOD PRESSURE SUPPORT)
PREFILLED_SYRINGE | INTRAVENOUS | Status: AC
  Filled 2011-09-30: qty 5

## 2011-09-30 MED ORDER — LACTATED RINGERS IV SOLN
INTRAVENOUS | Status: DC | PRN
  Administered 2011-09-30 (×4): via INTRAVENOUS

## 2011-09-30 MED ORDER — CEFAZOLIN SODIUM-DEXTROSE 2-3 GM-% IV SOLR
INTRAVENOUS | Status: AC
  Filled 2011-09-30: qty 50

## 2011-09-30 MED ORDER — MORPHINE SULFATE 0.5 MG/ML IJ SOLN
INTRAMUSCULAR | Status: AC
  Filled 2011-09-30: qty 10

## 2011-09-30 MED ORDER — HYDROMORPHONE HCL PF 1 MG/ML IJ SOLN
INTRAMUSCULAR | Status: AC
  Filled 2011-09-30: qty 1

## 2011-09-30 MED ORDER — LACTATED RINGERS IV SOLN
INTRAVENOUS | Status: DC | PRN
  Administered 2011-09-30: 21:00:00 via INTRAVENOUS

## 2011-09-30 MED ORDER — FENTANYL 2.5 MCG/ML BUPIVACAINE 1/10 % EPIDURAL INFUSION (WH - ANES)
INTRAMUSCULAR | Status: DC | PRN
  Administered 2011-09-30: 14 mL/h via EPIDURAL

## 2011-09-30 MED ORDER — KETOROLAC TROMETHAMINE 60 MG/2ML IM SOLN
60.0000 mg | Freq: Once | INTRAMUSCULAR | Status: AC | PRN
  Administered 2011-09-30: 60 mg via INTRAMUSCULAR

## 2011-09-30 MED ORDER — KETOROLAC TROMETHAMINE 60 MG/2ML IM SOLN
INTRAMUSCULAR | Status: AC
  Filled 2011-09-30: qty 2

## 2011-09-30 MED ORDER — SODIUM BICARBONATE 8.4 % IV SOLN
INTRAVENOUS | Status: DC | PRN
  Administered 2011-09-30: 4 mL via EPIDURAL

## 2011-09-30 MED ORDER — SCOPOLAMINE 1 MG/3DAYS TD PT72
MEDICATED_PATCH | TRANSDERMAL | Status: AC
  Filled 2011-09-30: qty 1

## 2011-09-30 MED ORDER — CEFAZOLIN SODIUM-DEXTROSE 2-3 GM-% IV SOLR: 2.0000 g | INTRAVENOUS | Status: DC

## 2011-09-30 MED ORDER — OXYTOCIN 10 UNIT/ML IJ SOLN
INTRAMUSCULAR | Status: AC
  Filled 2011-09-30: qty 4

## 2011-09-30 MED ORDER — ONDANSETRON HCL 4 MG/2ML IJ SOLN
INTRAMUSCULAR | Status: DC | PRN
  Administered 2011-09-30: 4 mg via INTRAVENOUS

## 2011-09-30 MED ORDER — CEFAZOLIN SODIUM 1-5 GM-% IV SOLN
INTRAVENOUS | Status: DC | PRN
  Administered 2011-09-30: 2 g via INTRAVENOUS

## 2011-09-30 MED ORDER — LIDOCAINE-EPINEPHRINE (PF) 2 %-1:200000 IJ SOLN
INTRAMUSCULAR | Status: AC
  Filled 2011-09-30: qty 20

## 2011-09-30 MED ORDER — SODIUM BICARBONATE 8.4 % IV SOLN
INTRAVENOUS | Status: AC
  Filled 2011-09-30: qty 50

## 2011-09-30 MED ORDER — OXYTOCIN 10 UNIT/ML IJ SOLN
INTRAMUSCULAR | Status: DC | PRN
  Administered 2011-09-30: 40 [IU]

## 2011-09-30 MED ORDER — MORPHINE SULFATE (PF) 0.5 MG/ML IJ SOLN
INTRAMUSCULAR | Status: DC | PRN
  Administered 2011-09-30: 2 ug via INTRAVENOUS

## 2011-09-30 MED ORDER — ONDANSETRON HCL 4 MG/2ML IJ SOLN
INTRAMUSCULAR | Status: AC
  Filled 2011-09-30: qty 2

## 2011-09-30 SURGICAL SUPPLY — 40 items
BENZOIN TINCTURE PRP APPL 2/3 (GAUZE/BANDAGES/DRESSINGS) ×2 IMPLANT
CANISTER WOUND CARE 500ML ATS (WOUND CARE) IMPLANT
CHLORAPREP W/TINT 26ML (MISCELLANEOUS) ×2 IMPLANT
CLOTH BEACON ORANGE TIMEOUT ST (SAFETY) ×2 IMPLANT
CONTAINER PREFILL 10% NBF 15ML (MISCELLANEOUS) IMPLANT
DRESSING TELFA 8X3 (GAUZE/BANDAGES/DRESSINGS) ×2 IMPLANT
DRSG COVADERM 4X10 (GAUZE/BANDAGES/DRESSINGS) ×2 IMPLANT
DRSG VAC ATS LRG SENSATRAC (GAUZE/BANDAGES/DRESSINGS) IMPLANT
DRSG VAC ATS MED SENSATRAC (GAUZE/BANDAGES/DRESSINGS) IMPLANT
DRSG VAC ATS SM SENSATRAC (GAUZE/BANDAGES/DRESSINGS) IMPLANT
ELECT REM PT RETURN 9FT ADLT (ELECTROSURGICAL) ×2
ELECTRODE REM PT RTRN 9FT ADLT (ELECTROSURGICAL) ×1 IMPLANT
EXTRACTOR VACUUM M CUP 4 TUBE (SUCTIONS) IMPLANT
GAUZE SPONGE 4X4 12PLY STRL LF (GAUZE/BANDAGES/DRESSINGS) ×4 IMPLANT
GLOVE BIO SURGEON STRL SZ 6.5 (GLOVE) ×4 IMPLANT
GOWN PREVENTION PLUS LG XLONG (DISPOSABLE) ×6 IMPLANT
KIT ABG SYR 3ML LUER SLIP (SYRINGE) IMPLANT
NEEDLE HYPO 25X5/8 SAFETYGLIDE (NEEDLE) ×2 IMPLANT
NS IRRIG 1000ML POUR BTL (IV SOLUTION) ×2 IMPLANT
PACK C SECTION WH (CUSTOM PROCEDURE TRAY) ×2 IMPLANT
PAD ABD 7.5X8 STRL (GAUZE/BANDAGES/DRESSINGS) ×2 IMPLANT
RTRCTR C-SECT PINK 25CM LRG (MISCELLANEOUS) IMPLANT
RTRCTR C-SECT PINK 34CM XLRG (MISCELLANEOUS) IMPLANT
SLEEVE SCD COMPRESS KNEE MED (MISCELLANEOUS) IMPLANT
STAPLER VISISTAT 35W (STAPLE) IMPLANT
STRIP CLOSURE SKIN 1/2X4 (GAUZE/BANDAGES/DRESSINGS) ×2 IMPLANT
SUT MNCRL 0 VIOLET CTX 36 (SUTURE) ×2 IMPLANT
SUT MNCRL AB 3-0 PS2 27 (SUTURE) IMPLANT
SUT MONOCRYL 0 CTX 36 (SUTURE) ×2
SUT PDS AB 0 CTX 36 PDP370T (SUTURE) ×2 IMPLANT
SUT PLAIN 0 NONE (SUTURE) IMPLANT
SUT VIC AB 0 CTXB 36 (SUTURE) IMPLANT
SUT VIC AB 2-0 CT1 (SUTURE) IMPLANT
SUT VIC AB 2-0 CT1 27 (SUTURE) ×1
SUT VIC AB 2-0 CT1 TAPERPNT 27 (SUTURE) ×1 IMPLANT
SUT VIC AB 2-0 SH 27 (SUTURE)
SUT VIC AB 2-0 SH 27XBRD (SUTURE) IMPLANT
TOWEL OR 17X24 6PK STRL BLUE (TOWEL DISPOSABLE) ×4 IMPLANT
TRAY FOLEY CATH 14FR (SET/KITS/TRAYS/PACK) ×2 IMPLANT
WATER STERILE IRR 1000ML POUR (IV SOLUTION) ×2 IMPLANT

## 2011-09-30 NOTE — Anesthesia Procedure Notes (Signed)

## 2011-09-30 NOTE — Transfer of Care (Signed)
Immediate Anesthesia Transfer of Care Note  Patient: Shari Hardin  Procedure(s) Performed: Procedure(s) (LRB): CESAREAN SECTION (N/A)  Patient Location: PACU  Anesthesia Type: Epidural  Level of Consciousness: awake  Airway & Oxygen Therapy: Patient Spontanous Breathing  Post-op Assessment: Report given to PACU RN  Post vital signs: Reviewed and stable  Complications: No apparent anesthesia complications

## 2011-09-30 NOTE — Anesthesia Preprocedure Evaluation (Signed)

## 2011-09-30 NOTE — Progress Notes (Signed)
Sundae B [place] is a 26 y.o. G2P0100 at [redacted]w[redacted]d by LMP admitted for induction of labor due to Poor fetal growth.  Subjective: Pushing  Objective: BP 132/65  Pulse 76  Temp 99.8 F (37.7 C) (Oral)  Resp 18  Ht 5\' 5"  (1.651 m)  Wt 81.647 kg (180 lb)  BMI 29.95 kg/m2  SpO2 99%  LMP 12/27/2010 I/O last 3 completed shifts: In: -  Out: 450 [Urine:450] Total I/O In: -  Out: 500 [Urine:500]  FHT:  FHR: 160 bpm, variability: moderate,  accelerations:  Abscent,  decelerations:  Absent UC:   regular, every 2 minutes SVE:   Dilation: 10 Effacement (%): 100 Station: +2 (caput) Exam by:: Dr Tamela Oddi  Labs: Lab Results  Component Value Date   WBC 15.0* 09/28/2011   HGB 10.7* 09/28/2011   HCT 31.0* 09/28/2011   MCV 102.3* 09/28/2011   PLT 191 09/28/2011    Assessment / Plan: Stage II, slow descent  Labor: see above Preeclampsia:  n/a Fetal Wellbeing:  Category I Pain Control:  Epidural I/D:  n/a Monitor progress  JACKSON-MOORE,Mcdaniel Ohms A 09/30/2011, 8:04 PM

## 2011-09-30 NOTE — Op Note (Signed)
Cesarean Section Procedure Note   Shari Hardin   09/28/2011 - 09/30/2011  Indications: Arrest of descent   Pre-operative Diagnosis: Arrest of desent.   Post-operative Diagnosis: Same   Surgeon: Antionette Char A  Assistants: none  Anesthesia: epidural  Procedure Details:  The patient was seen on the Labor & Delivery Unit. The risks, benefits, complications, treatment options, and expected outcomes were discussed with the patient. The patient concurred with the proposed plan, giving informed consent. The patient was identified as [name] and the procedure verified as C-Section Delivery. A Time Out was held and the above information confirmed.  After induction of anesthesia, the patient was draped and prepped in the usual sterile manner. A transverse incision was made and carried down through the subcutaneous tissue to the fascia. The fascial incision was made and extended transversely. The fascia was separated from the underlying rectus tissue superiorly and inferiorly. The peritoneum was identified and entered. The peritoneal incision was extended longitudinally. The utero-vesical peritoneal reflection was incised transversely and the bladder flap was bluntly freed from the lower uterine segment. A low transverse uterine incision was made. Delivered from cephalic presentation was a  living newborn female infant. A cord ph was not sent. The umbilical cord was clamped and cut cord.  The placenta was removed Intact and appeared normal.  The uterine incision was closed with running locked sutures of 1-0 Monocryl. A second imbricating layer of the same suture was placed.  Hemostasis was observed. The paracolic gutters were irrigated. The fascia was then reapproximated with running sutures of 1-0Vicryl. The subcuticular closure was performed using 3-0 Monocryl.  Instrument, sponge, and needle counts were correct prior the abdominal closure and were correct at the conclusion of the case.     Findings:  See above   Estimated Blood Loss: 1000 ml  Total IV Fluids: per Anesthesiology  Urine Output: per Anesthesiology  Specimens: none   Complications: no complications  Disposition: PACU - hemodynamically stable.  Maternal Condition: stable   Baby condition / location:  nursery-stable    Signed: Surgeon(s): Antionette Char, MD

## 2011-09-30 NOTE — Anesthesia Postprocedure Evaluation (Signed)
  Anesthesia Post-op Note  Patient: Shari Hardin  Procedure(s) Performed: Procedure(s) (LRB): CESAREAN SECTION (N/A)   Patient is awake, responsive, moving her legs, and has signs of resolution of her numbness. Pain and nausea are reasonably well controlled. Vital signs are stable and clinically acceptable. Oxygen saturation is clinically acceptable. There are no apparent anesthetic complications at this time. Patient is ready for discharge.

## 2011-09-30 NOTE — Progress Notes (Signed)
Shari Hardin is a 26 y.o. G2P0100 at [redacted]w[redacted]d by LMP admitted for induction of labor due to Poor fetal growth.  Subjective:   Objective: BP 114/64  Pulse 73  Temp 98.1 F (36.7 C) (Oral)  Resp 16  Ht 5\' 5"  (1.651 m)  Wt 81.647 kg (180 lb)  BMI 29.95 kg/m2  SpO2 99%  LMP 12/27/2010 I/O last 3 completed shifts: In: -  Out: 450 [Urine:450]    FHT:  FHR: 140 bpm, variability: moderate,  accelerations:  Present,  decelerations:  Absent UC:   regular, every 3-5 minutes SVE:   Dilation: 7 Effacement (%): 90 Station: -1 Exam by:: [employer] RN  Labs: Lab Results  Component Value Date   WBC 15.0* 09/28/2011   HGB 10.7* 09/28/2011   HCT 31.0* 09/28/2011   MCV 102.3* 09/28/2011   PLT 191 09/28/2011    Assessment / Plan: Induction of labor due to IUGR,  progressing well on pitocin  Labor: Progressing on Pitocin, will continue to increase then AROM Preeclampsia:  n/a Fetal Wellbeing:  Category I Pain Control:  Epidural I/D:  n/a Anticipated MOD:  NSVD  Dakin Madani A 09/30/2011, 8:18 AM

## 2011-09-30 NOTE — Progress Notes (Signed)
SVE: no change  A/P: Arrest of descent  -->C/D; informed consent obtained

## 2011-10-01 ENCOUNTER — Ambulatory Visit (HOSPITAL_COMMUNITY): Payer: Medicaid Other

## 2011-10-01 LAB — CBC
HCT: 23.5 % — ABNORMAL LOW (ref 36.0–46.0)
Hemoglobin: 7.9 g/dL — ABNORMAL LOW (ref 12.0–15.0)
RBC: 2.28 MIL/uL — ABNORMAL LOW (ref 3.87–5.11)
WBC: 17.1 10*3/uL — ABNORMAL HIGH (ref 4.0–10.5)

## 2011-10-01 MED ORDER — DIPHENHYDRAMINE HCL 50 MG/ML IJ SOLN: 12.5000 mg | INTRAMUSCULAR | Status: DC | PRN

## 2011-10-01 MED ORDER — ONDANSETRON HCL 4 MG/2ML IJ SOLN: 4.0000 mg | Freq: Three times a day (TID) | INTRAMUSCULAR | Status: DC | PRN

## 2011-10-01 MED ORDER — KETOROLAC TROMETHAMINE 30 MG/ML IJ SOLN: 30.0000 mg | Freq: Four times a day (QID) | INTRAMUSCULAR | Status: AC | PRN

## 2011-10-01 MED ORDER — OXYCODONE-ACETAMINOPHEN 5-325 MG PO TABS: 1.0000 | ORAL_TABLET | ORAL | Status: DC | PRN

## 2011-10-01 MED ORDER — IBUPROFEN 600 MG PO TABS
600.0000 mg | ORAL_TABLET | Freq: Four times a day (QID) | ORAL | Status: DC
  Administered 2011-10-01 – 2011-10-03 (×8): 600 mg via ORAL
  Filled 2011-10-01 (×9): qty 1

## 2011-10-01 MED ORDER — DIPHENHYDRAMINE HCL 50 MG/ML IJ SOLN: 25.0000 mg | INTRAMUSCULAR | Status: DC | PRN

## 2011-10-01 MED ORDER — KETOROLAC TROMETHAMINE 30 MG/ML IJ SOLN
30.0000 mg | Freq: Four times a day (QID) | INTRAMUSCULAR | Status: AC | PRN
  Administered 2011-10-01: 30 mg via INTRAVENOUS
  Filled 2011-10-01: qty 1

## 2011-10-01 MED ORDER — METOCLOPRAMIDE HCL 5 MG/ML IJ SOLN: 10.0000 mg | Freq: Three times a day (TID) | INTRAMUSCULAR | Status: DC | PRN

## 2011-10-01 MED ORDER — WITCH HAZEL-GLYCERIN EX PADS: 1.0000 "application " | MEDICATED_PAD | CUTANEOUS | Status: DC | PRN

## 2011-10-01 MED ORDER — SODIUM CHLORIDE 0.9 % IJ SOLN: 3.0000 mL | INTRAMUSCULAR | Status: DC | PRN

## 2011-10-01 MED ORDER — DIBUCAINE 1 % RE OINT: 1.0000 "application " | TOPICAL_OINTMENT | RECTAL | Status: DC | PRN

## 2011-10-01 MED ORDER — SENNOSIDES-DOCUSATE SODIUM 8.6-50 MG PO TABS
2.0000 | ORAL_TABLET | Freq: Every day | ORAL | Status: DC
  Administered 2011-10-01: 2 via ORAL

## 2011-10-01 MED ORDER — SIMETHICONE 80 MG PO CHEW
80.0000 mg | CHEWABLE_TABLET | ORAL | Status: DC | PRN
  Administered 2011-10-02: 80 mg via ORAL

## 2011-10-01 MED ORDER — MEDROXYPROGESTERONE ACETATE 150 MG/ML IM SUSP: 150.0000 mg | INTRAMUSCULAR | Status: DC | PRN

## 2011-10-01 MED ORDER — ZOLPIDEM TARTRATE 5 MG PO TABS: 5.0000 mg | ORAL_TABLET | Freq: Every evening | ORAL | Status: DC | PRN

## 2011-10-01 MED ORDER — FERROUS SULFATE 325 (65 FE) MG PO TABS
325.0000 mg | ORAL_TABLET | Freq: Two times a day (BID) | ORAL | Status: DC
  Administered 2011-10-01 – 2011-10-03 (×5): 325 mg via ORAL
  Filled 2011-10-01 (×5): qty 1

## 2011-10-01 MED ORDER — TETANUS-DIPHTH-ACELL PERTUSSIS 5-2.5-18.5 LF-MCG/0.5 IM SUSP
0.5000 mL | Freq: Once | INTRAMUSCULAR | Status: AC
  Administered 2011-10-01: 0.5 mL via INTRAMUSCULAR
  Filled 2011-10-01: qty 0.5

## 2011-10-01 MED ORDER — SODIUM CHLORIDE 0.9 % IV SOLN
1.0000 ug/kg/h | INTRAVENOUS | Status: DC | PRN
  Filled 2011-10-01: qty 2.5

## 2011-10-01 MED ORDER — NALBUPHINE HCL 10 MG/ML IJ SOLN
5.0000 mg | INTRAMUSCULAR | Status: DC | PRN
  Filled 2011-10-01: qty 1

## 2011-10-01 MED ORDER — MEASLES, MUMPS & RUBELLA VAC ~~LOC~~ INJ
0.5000 mL | INJECTION | Freq: Once | SUBCUTANEOUS | Status: DC
  Filled 2011-10-01: qty 0.5

## 2011-10-01 MED ORDER — DIPHENHYDRAMINE HCL 25 MG PO CAPS: 25.0000 mg | ORAL_CAPSULE | Freq: Four times a day (QID) | ORAL | Status: DC | PRN

## 2011-10-01 MED ORDER — IBUPROFEN 600 MG PO TABS: 600.0000 mg | ORAL_TABLET | Freq: Four times a day (QID) | ORAL | Status: DC | PRN

## 2011-10-01 MED ORDER — ONDANSETRON HCL 4 MG/2ML IJ SOLN: 4.0000 mg | INTRAMUSCULAR | Status: DC | PRN

## 2011-10-01 MED ORDER — MAGNESIUM HYDROXIDE 400 MG/5ML PO SUSP: 30.0000 mL | ORAL | Status: DC | PRN

## 2011-10-01 MED ORDER — NALOXONE HCL 0.4 MG/ML IJ SOLN: 0.4000 mg | INTRAMUSCULAR | Status: DC | PRN

## 2011-10-01 MED ORDER — DIPHENHYDRAMINE HCL 25 MG PO CAPS: 25.0000 mg | ORAL_CAPSULE | ORAL | Status: DC | PRN

## 2011-10-01 MED ORDER — PRENATAL MULTIVITAMIN CH
1.0000 | ORAL_TABLET | Freq: Every day | ORAL | Status: DC
  Administered 2011-10-01 – 2011-10-03 (×2): 1 via ORAL
  Filled 2011-10-01 (×3): qty 1

## 2011-10-01 MED ORDER — ONDANSETRON HCL 4 MG PO TABS: 4.0000 mg | ORAL_TABLET | ORAL | Status: DC | PRN

## 2011-10-01 MED ORDER — LANOLIN HYDROUS EX OINT: 1.0000 "application " | TOPICAL_OINTMENT | CUTANEOUS | Status: DC | PRN

## 2011-10-01 MED ORDER — OXYTOCIN 40 UNITS IN LACTATED RINGERS INFUSION - SIMPLE MED: 62.5000 mL/h | INTRAVENOUS | Status: DC

## 2011-10-01 MED ORDER — LACTATED RINGERS IV SOLN
INTRAVENOUS | Status: DC
  Administered 2011-10-01 (×3): via INTRAVENOUS

## 2011-10-01 NOTE — Addendum Note (Signed)
Addendum  created 10/01/11 9147 by Orlie Pollen, CRNA   Modules edited:Notes Section

## 2011-10-01 NOTE — Progress Notes (Signed)
Subjective: Postpartum Day 1: Cesarean Delivery Patient reports incisional pain, tolerating PO and no problems voiding.    Objective: Vital signs in last 24 hours: Temp:  [98.1 F (36.7 C)-100.6 F (38.1 C)] 98.9 F (37.2 C) (08/01 0521) Pulse Rate:  [70-150] 78  (08/01 0616) Resp:  [15-22] 18  (08/01 0521) BP: (102-167)/(41-142) 104/77 mmHg (08/01 0521) SpO2:  [95 %-98 %] 96 % (08/01 0616)  Physical Exam:  General: alert and no distress Lochia: appropriate Uterine Fundus: firm Incision: healing well DVT Evaluation: No evidence of DVT seen on physical exam.   Basename 10/01/11 0515 09/30/11 2250  HGB 7.9* 8.4*  HCT 23.5* 24.5*    Assessment/Plan: Status post Cesarean section. Doing well postoperatively.  Continue current care.  Adina Puzzo A 10/01/2011, 8:16 AM

## 2011-10-01 NOTE — Anesthesia Postprocedure Evaluation (Signed)
  Anesthesia Post-op Note  Patient: Shari Hardin  Procedure(s) Performed: Procedure(s) (LRB): CESAREAN SECTION (N/A)  Patient Location: PACU and Mother/Baby  Anesthesia Type: Epidural  Level of Consciousness: awake, alert , oriented and patient cooperative  Airway and Oxygen Therapy: Patient Spontanous Breathing  Post-op Pain: none  Post-op Assessment: Post-op Vital signs reviewed and Patient's Cardiovascular Status Stable  Post-op Vital Signs: Reviewed and stable  Complications: No apparent anesthesia complications

## 2011-10-01 NOTE — Progress Notes (Signed)
UR chart review completed.  

## 2011-10-02 ENCOUNTER — Encounter (HOSPITAL_COMMUNITY): Payer: Self-pay | Admitting: Obstetrics & Gynecology

## 2011-10-02 NOTE — Progress Notes (Signed)
Subjective: Postpartum Day 2: Cesarean Delivery Patient reports tolerating PO, + flatus and no problems voiding.    Objective: Vital signs in last 24 hours: Temp:  [98.3 F (36.8 C)-98.9 F (37.2 C)] 98.3 F (36.8 C) (08/02 0520) Pulse Rate:  [81-99] 88  (08/02 0520) Resp:  [16-20] 18  (08/02 0520) BP: (99-107)/(58-69) 99/64 mmHg (08/02 0520) SpO2:  [97 %-99 %] 98 % (08/01 2221)  Physical Exam:  General: alert and no distress Lochia: appropriate Uterine Fundus: firm Incision: healing well DVT Evaluation: No evidence of DVT seen on physical exam.   Basename 10/01/11 0515 09/30/11 2250  HGB 7.9* 8.4*  HCT 23.5* 24.5*    Assessment/Plan: Status post Cesarean section. Doing well postoperatively.  Continue current care.  HARPER,CHARLES A 10/02/2011, 10:39 AM

## 2011-10-03 MED ORDER — OXYCODONE-ACETAMINOPHEN 5-325 MG PO TABS: 1.0000 | ORAL_TABLET | ORAL | Status: AC | PRN

## 2011-10-03 MED ORDER — IBUPROFEN 600 MG PO TABS: 600.0000 mg | ORAL_TABLET | Freq: Four times a day (QID) | ORAL | Status: DC

## 2011-10-03 NOTE — Discharge Summary (Signed)
Obstetric Discharge Summary Reason for Admission: onset of labor Prenatal Procedures: ultrasound Intrapartum Procedures: cesarean: low cervical, transverse Postpartum Procedures: none Complications-Operative and Postpartum: none Hemoglobin  Date Value Range Status  10/01/2011 7.9* 12.0 - 15.0 g/dL Final     HCT  Date Value Range Status  10/01/2011 23.5* 36.0 - 46.0 % Final    Physical Exam:  General: alert and no distress Lochia: appropriate Uterine Fundus: firm Incision: healing well DVT Evaluation: No evidence of DVT seen on physical exam.  Discharge Diagnoses: Term Pregnancy-delivered  Discharge Information: Date: 10/03/2011 Activity: pelvic rest Diet: routine Medications: PNV, Ibuprofen, Colace and Percocet Condition: stable Instructions: refer to practice specific booklet Discharge to: home Follow-up Information    Follow up with Jarell Mcewen A, MD. Schedule an appointment as soon as possible for a visit in 6 weeks.   Contact information:   [address] [place] [address] [phone]          Newborn Data: Live born female  Birth Weight: 6 lb 7.2 oz (2925 g) APGAR: 9, 9  Home with mother.  Daja Shuping A 10/03/2011, 9:12 AM

## 2011-10-03 NOTE — Progress Notes (Signed)
Subjective: Postpartum Day 3: Cesarean Delivery Patient reports tolerating PO, + flatus, + BM and no problems voiding.    Objective: Vital signs in last 24 hours: Temp:  [97.8 F (36.6 C)-98.1 F (36.7 C)] 97.8 F (36.6 C) (08/03 0630) Pulse Rate:  [85-97] 85  (08/03 0630) Resp:  [16-18] 18  (08/03 0850) BP: (99-125)/(51-77) 100/66 mmHg (08/03 0630) SpO2:  [99 %] 99 % (08/03 0630)  Physical Exam:  General: alert and no distress Lochia: appropriate Uterine Fundus: firm Incision: healing well DVT Evaluation: No evidence of DVT seen on physical exam.   Basename 10/01/11 0515 09/30/11 2250  HGB 7.9* 8.4*  HCT 23.5* 24.5*    Assessment/Plan: Status post Cesarean section. Doing well postoperatively.  Discharge home with standard precautions and return to clinic in 2 weeks.  HARPER,CHARLES A 10/03/2011, 9:03 AM

## 2011-10-03 NOTE — Plan of Care (Signed)
Problem: Discharge Progression Outcomes Goal: Other Discharge Outcomes/Goals Outcome: Completed/Met Date Met:  10/03/11 Lives locally, [place]. Aware can call [facility] for OP assist as needed.

## 2014-01-01 ENCOUNTER — Encounter (HOSPITAL_COMMUNITY): Payer: Self-pay | Admitting: Obstetrics & Gynecology

## 2014-01-04 ENCOUNTER — Encounter (HOSPITAL_COMMUNITY): Payer: Self-pay | Admitting: Emergency Medicine

## 2014-01-04 ENCOUNTER — Emergency Department (HOSPITAL_COMMUNITY)
Admission: EM | Admit: 2014-01-04 | Discharge: 2014-01-04 | Disposition: A | Discharge status: Home / Self Care | Payer: Medicaid Other | Attending: Emergency Medicine | Admitting: Emergency Medicine

## 2014-01-04 ENCOUNTER — Emergency Department (HOSPITAL_COMMUNITY): Payer: Medicaid Other

## 2014-01-04 DIAGNOSIS — Z3490 Encounter for supervision of normal pregnancy, unspecified, unspecified trimester: Secondary | ICD-10-CM

## 2014-01-04 DIAGNOSIS — O23591 Infection of other part of genital tract in pregnancy, first trimester: Secondary | ICD-10-CM | POA: Diagnosis not present

## 2014-01-04 DIAGNOSIS — O99331 Smoking (tobacco) complicating pregnancy, first trimester: Secondary | ICD-10-CM | POA: Insufficient documentation

## 2014-01-04 DIAGNOSIS — B9689 Other specified bacterial agents as the cause of diseases classified elsewhere: Secondary | ICD-10-CM

## 2014-01-04 DIAGNOSIS — N760 Acute vaginitis: Secondary | ICD-10-CM

## 2014-01-04 DIAGNOSIS — R109 Unspecified abdominal pain: Secondary | ICD-10-CM

## 2014-01-04 DIAGNOSIS — F17210 Nicotine dependence, cigarettes, uncomplicated: Secondary | ICD-10-CM | POA: Insufficient documentation

## 2014-01-04 DIAGNOSIS — Z3A01 Less than 8 weeks gestation of pregnancy: Secondary | ICD-10-CM | POA: Diagnosis not present

## 2014-01-04 DIAGNOSIS — O9989 Other specified diseases and conditions complicating pregnancy, childbirth and the puerperium: Secondary | ICD-10-CM | POA: Insufficient documentation

## 2014-01-04 LAB — CBC WITH DIFFERENTIAL/PLATELET
BASOS ABS: 0.1 10*3/uL (ref 0.0–0.1)
BASOS PCT: 1 % (ref 0–1)
EOS ABS: 0.3 10*3/uL (ref 0.0–0.7)
EOS PCT: 3 % (ref 0–5)
HEMATOCRIT: 35.9 % — AB (ref 36.0–46.0)
HEMOGLOBIN: 12.4 g/dL (ref 12.0–15.0)
Lymphocytes Relative: 24 % (ref 12–46)
Lymphs Abs: 2.6 10*3/uL (ref 0.7–4.0)
MCH: 33.3 pg (ref 26.0–34.0)
MCHC: 34.5 g/dL (ref 30.0–36.0)
MCV: 96.5 fL (ref 78.0–100.0)
MONO ABS: 0.7 10*3/uL (ref 0.1–1.0)
MONOS PCT: 7 % (ref 3–12)
NEUTROS ABS: 7.1 10*3/uL (ref 1.7–7.7)
Neutrophils Relative %: 65 % (ref 43–77)
Platelets: 228 10*3/uL (ref 150–400)
RBC: 3.72 MIL/uL — ABNORMAL LOW (ref 3.87–5.11)
RDW: 12.7 % (ref 11.5–15.5)
WBC: 10.8 10*3/uL — ABNORMAL HIGH (ref 4.0–10.5)

## 2014-01-04 LAB — URINALYSIS, ROUTINE W REFLEX MICROSCOPIC
BILIRUBIN URINE: NEGATIVE
Bilirubin Urine: NEGATIVE
Glucose, UA: NEGATIVE mg/dL
Glucose, UA: NEGATIVE mg/dL
Hgb urine dipstick: NEGATIVE
Ketones, ur: NEGATIVE mg/dL
Ketones, ur: NEGATIVE mg/dL
LEUKOCYTES UA: NEGATIVE
NITRITE: NEGATIVE
NITRITE: NEGATIVE
PH: 7.5 (ref 5.0–8.0)
PROTEIN: NEGATIVE mg/dL
Protein, ur: NEGATIVE mg/dL
SPECIFIC GRAVITY, URINE: 1.015 (ref 1.005–1.030)
Specific Gravity, Urine: 1.006 (ref 1.005–1.030)
UROBILINOGEN UA: 1 mg/dL (ref 0.0–1.0)
UROBILINOGEN UA: 1 mg/dL (ref 0.0–1.0)
pH: 7 (ref 5.0–8.0)

## 2014-01-04 LAB — WET PREP, GENITAL
Trich, Wet Prep: NONE SEEN
Yeast Wet Prep HPF POC: NONE SEEN

## 2014-01-04 LAB — COMPREHENSIVE METABOLIC PANEL
ALBUMIN: 3.8 g/dL (ref 3.5–5.2)
ALT: 16 U/L (ref 0–35)
ANION GAP: 13 (ref 5–15)
AST: 19 U/L (ref 0–37)
Alkaline Phosphatase: 40 U/L (ref 39–117)
BILIRUBIN TOTAL: 1.6 mg/dL — AB (ref 0.3–1.2)
BUN: 7 mg/dL (ref 6–23)
CALCIUM: 9.1 mg/dL (ref 8.4–10.5)
CHLORIDE: 102 meq/L (ref 96–112)
CO2: 21 mEq/L (ref 19–32)
CREATININE: 0.61 mg/dL (ref 0.50–1.10)
GFR calc Af Amer: >90 mL/min (ref >90)
GFR calc non Af Amer: >90 mL/min (ref >90)
Glucose, Bld: 123 mg/dL — ABNORMAL HIGH (ref 70–99)
Potassium: 3.8 mEq/L (ref 3.7–5.3)
Sodium: 136 mEq/L — ABNORMAL LOW (ref 137–147)
TOTAL PROTEIN: 6.9 g/dL (ref 6.0–8.3)

## 2014-01-04 LAB — URINE MICROSCOPIC-ADD ON

## 2014-01-04 LAB — HCG, QUANTITATIVE, PREGNANCY: hCG, Beta Chain, Quant, S: 24932 m[IU]/mL — ABNORMAL HIGH (ref <5)

## 2014-01-04 LAB — POC URINE PREG, ED: Preg Test, Ur: POSITIVE — AB

## 2014-01-04 LAB — LIPASE, BLOOD: LIPASE: 29 U/L (ref 11–59)

## 2014-01-04 LAB — HIV ANTIBODY (ROUTINE TESTING W REFLEX): HIV 1&2 Ab, 4th Generation: NONREACTIVE

## 2014-01-04 LAB — RPR

## 2014-01-04 MED ORDER — LIDOCAINE HCL (PF) 1 % IJ SOLN
INTRAMUSCULAR | Status: AC
  Filled 2014-01-04: qty 5

## 2014-01-04 MED ORDER — LIDOCAINE HCL (PF) 1 % IJ SOLN
INTRAMUSCULAR | Status: AC
  Administered 2014-01-04: 2 mL via INTRADERMAL
  Filled 2014-01-04: qty 5

## 2014-01-04 MED ORDER — ACETAMINOPHEN 325 MG PO TABS
650.0000 mg | ORAL_TABLET | Freq: Once | ORAL | Status: AC
  Administered 2014-01-04: 650 mg via ORAL
  Filled 2014-01-04: qty 2

## 2014-01-04 MED ORDER — CEFTRIAXONE SODIUM 250 MG IJ SOLR
250.0000 mg | Freq: Once | INTRAMUSCULAR | Status: AC
  Administered 2014-01-04: 250 mg via INTRAMUSCULAR
  Filled 2014-01-04: qty 250

## 2014-01-04 MED ORDER — METRONIDAZOLE 0.75 % VA GEL: 1.0000 | Freq: Two times a day (BID) | VAGINAL | Status: AC

## 2014-01-04 MED ORDER — LIDOCAINE HCL (PF) 1 % IJ SOLN
2.0000 mL | Freq: Once | INTRAMUSCULAR | Status: AC
  Administered 2014-01-04: 2 mL via INTRADERMAL

## 2014-01-04 MED ORDER — PRENATAL MULTIVITAMIN CH: 1.0000 | ORAL_TABLET | Freq: Every day | ORAL | Status: AC

## 2014-01-04 MED ORDER — ONDANSETRON 4 MG PO TBDP
4.0000 mg | ORAL_TABLET | Freq: Once | ORAL | Status: AC
  Administered 2014-01-04: 4 mg via ORAL
  Filled 2014-01-04: qty 1

## 2014-01-04 MED ORDER — METOCLOPRAMIDE HCL 10 MG PO TABS: 10.0000 mg | ORAL_TABLET | Freq: Three times a day (TID) | ORAL | Status: DC | PRN

## 2014-01-04 MED ORDER — AZITHROMYCIN 250 MG PO TABS
1000.0000 mg | ORAL_TABLET | Freq: Once | ORAL | Status: AC
  Administered 2014-01-04: 1000 mg via ORAL
  Filled 2014-01-04: qty 4

## 2014-01-04 NOTE — Discharge Instructions (Signed)
You are pregnant and your baby is estimated to be 5 weeks and 6 days.  Please follow up with your OBGYN for further care.  Take prenatal vitamin daily.  Apply metrogel vaginal cream twice daily for the next 5 days as treatment for bacterial vaginosis.  Return if your pain worsen or if you have other concerns.    Abdominal Pain During Pregnancy Abdominal pain is common in pregnancy. Most of the time, it does not cause harm. There are many causes of abdominal pain. Some causes are more serious than others. Some of the causes of abdominal pain in pregnancy are easily diagnosed. Occasionally, the diagnosis takes time to understand. Other times, the cause is not determined. Abdominal pain can be a sign that something is very wrong with the pregnancy, or the pain may have nothing to do with the pregnancy at all. For this reason, always tell your health care provider if you have any abdominal discomfort. HOME CARE INSTRUCTIONS  Monitor your abdominal pain for any changes. The following actions may help to alleviate any discomfort you are experiencing:  Do not have sexual intercourse or put anything in your vagina until your symptoms go away completely.  Get plenty of rest until your pain improves.  Drink clear fluids if you feel nauseous. Avoid solid food as long as you are uncomfortable or nauseous.  Only take over-the-counter or prescription medicine as directed by your health care provider.  Keep all follow-up appointments with your health care provider. SEEK IMMEDIATE MEDICAL CARE IF:  You are bleeding, leaking fluid, or passing tissue from the vagina.  You have increasing pain or cramping.  You have persistent vomiting.  You have painful or bloody urination.  You have a fever.  You notice a decrease in your baby's movements.  You have extreme weakness or feel faint.  You have shortness of breath, with or without abdominal pain.  You develop a severe headache with abdominal  pain.  You have abnormal vaginal discharge with abdominal pain.  You have persistent diarrhea.  You have abdominal pain that continues even after rest, or gets worse. MAKE SURE YOU:   Understand these instructions.  Will watch your condition.  Will get help right away if you are not doing well or get worse. Document Released: 02/16/2005 Document Revised: 12/07/2012 Document Reviewed: 09/15/2012 [facility] Patient Information 2015 [employer], [place]. This information is not intended to replace advice given to you by your health care provider. Make sure you discuss any questions you have with your health care provider.

## 2014-01-04 NOTE — ED Provider Notes (Signed)
Complainsfof left-sided crampy lower abdominal pain onset this morning accompanied by nausea.pain is improved with his treatment in the emergency room with Tylenol.intrauterine pregnant confirmed on ultrasound.  Doug SouSam Kinslea Frances, MD 01/04/14 1120

## 2014-01-04 NOTE — ED Notes (Signed)
Chaperoned Denny PeonErin, RN with assistance with in and out cath; pt tolerated procedure well; family stayed at bedside

## 2014-01-04 NOTE — ED Notes (Signed)
ladft sided abd pain x 3 days vomited yesterday LMP 9/24 pain is crampy staes has vag d/c denies dysuria

## 2014-01-04 NOTE — ED Notes (Signed)
Pelvic cart set up at bedside; phlebotomy at bedside now drawing labs; family in room also

## 2014-01-04 NOTE — ED Provider Notes (Signed)
CSN: [id number]     Arrival date & time 01/04/14  [id number] History   First MD Initiated Contact with Patient 01/04/14 [phone]     Chief Complaint  Patient presents with  . Abdominal Pain     (Consider location/radiation/quality/duration/timing/severity/associated sxs/prior Treatment) HPI   28 year old G2 19P1 female with no significant past medical history who presents complaining of abdominal pain.patient reports for the past 4 days she has had progressive worsening left lower quadrant abdominal pain. She described pain as a cramping sensation, nonradiating, persistent, nothing seems to make it better or worse. She has tried drinking tea and using warm compress with minimal relief. She endorses feeling nauseous yesterday and vomited once of food product nonbloody nonbilious. Last bowel movement was yesterday and was normal. She reported having urinary frequency without burning urination or hematuria. Patient report mild vaginal discharge without any strong odor.  She reported having discomfort with sexual activities, last sexual activities was last week. Patient has the same sexual partner for the past 6 months. Her last menstrual period was September 24.no comparisons of fever, chills, headache, chest pain, shortness of breath, productive cough, back pain, hematuria, hematochezia or melena.  Past Medical History  Diagnosis Date  . No pertinent past medical history    Past Surgical History  Procedure Laterality Date  . No past surgeries    . Cesarean section  09/30/2011    Procedure: CESAREAN SECTION;  Surgeon: Antionette CharLisa Jackson-Moore, MD;  Location: [facility];  Service: Gynecology;  Laterality: N/A;   Family History  Problem Relation Age of Onset  . Hypertension Mother   . Diabetes Father   . Hypertension Father    History  Substance Use Topics  . Smoking status: Current Every Day Smoker    Last Attempt to Quit: 02/02/2011  . Smokeless tobacco: Never Used  . Alcohol Use: Yes   OB History     Gravida Para Term Preterm AB TAB SAB Ectopic Multiple Living   2 2 1 1  0 0 0 0 1 1     Review of Systems  All other systems reviewed and are negative.     Allergies  Review of patient's allergies indicates no known allergies.  Home Medications   Prior to Admission medications   Medication Sig Start Date End Date Taking? Authorizing Provider  ibuprofen (ADVIL,MOTRIN) 600 MG tablet Take 1 tablet (600 mg total) by mouth every 6 (six) hours. 10/03/11 10/13/11  Brock Badharles A Harper, MD  loratadine (CLARITIN) 10 MG tablet Take 10 mg by mouth daily as needed. allergies    Historical Provider, MD  Prenatal Vit-Fe Fumarate-FA (PRENATAL MULTIVITAMIN) TABS Take 1 tablet by mouth at bedtime.     Historical Provider, MD   BP 114/63 mmHg  Pulse 87  Temp(Src) 98 F (36.7 C) (Oral)  Resp 20  Ht 5\' 6"  (1.676 m)  Wt 150 lb (68.04 kg)  BMI 24.22 kg/m2  SpO2 100% Physical Exam  Constitutional: She appears well-developed and well-nourished. No distress.  HENT:  Head: Normocephalic and atraumatic.  Eyes: Conjunctivae are normal.  Neck: Normal range of motion. Neck supple.  Cardiovascular: Normal rate and regular rhythm.   Pulmonary/Chest: Effort normal and breath sounds normal. She exhibits no tenderness.  Abdominal: Soft. There is no tenderness.  Genitourinary: Uterus normal. There is no rash or lesion on the right labia. There is no rash or lesion on the left labia. Cervix exhibits no motion tenderness and no discharge. Right adnexum displays tenderness. Right adnexum displays no mass. Left  adnexum displays no mass and no tenderness. No erythema, tenderness or bleeding in the vagina. Vaginal discharge found.  Chaperone present  Mild R adnexal tenderness, no mass Cervical os is closed  Lymphadenopathy:       Right: No inguinal adenopathy present.       Left: No inguinal adenopathy present.  Nursing note and vitals reviewed.   ED Course  Procedures (including critical care time)  9:34  AM Patient presents with low abdominal pain. Her pregnancy test is positive. Urine shows many squamous epithelial with minimal WBC. There are many bacteria. Plan to perform pelvic exam follows with transvaginal OB ultrasound. No vaginal bleeding.  9:52 AM Patient has moderate vaginal discharge but no significant discomfort on pelvic examination, except mild R adnexal tenderness.  Cervical os is closed. Will follow-up with an OB ultrasound to confirm IUP.  11:01 AM Clue cells TNTC, will treat for BV.  Wet prep WBC TNTC and since pt has some pelvic discomfort on exam, will preemptively treat for possible STI with Rocephin/Zithromax.  Cultures sent.  On recheck UA is without evidence of infection.  Transvaginal US confirmed IUP estimated to be 5 weeks and 6 days.  Pt made aware of finding.  Will prescribe reglan for nausea, metrogel for BV, prenatal vitamin and close f/u with OBGYN Dr. Clearance CootsHarper.  Care discussed with Dr. Ethelda ChickJacubowitz.   Labs Review Labs Reviewed  WET PREP, GENITAL - Abnormal; Notable for the following:    Clue Cells Wet Prep HPF POC TOO NUMEROUS TO COUNT (*)    WBC, Wet Prep HPF POC TOO NUMEROUS TO COUNT (*)    All other components within normal limits  CBC WITH DIFFERENTIAL - Abnormal; Notable for the following:    WBC 10.8 (*)    RBC 3.72 (*)    HCT 35.9 (*)    All other components within normal limits  COMPREHENSIVE METABOLIC PANEL - Abnormal; Notable for the following:    Sodium 136 (*)    Glucose, Bld 123 (*)    Total Bilirubin 1.6 (*)    All other components within normal limits  URINALYSIS, ROUTINE W REFLEX MICROSCOPIC - Abnormal; Notable for the following:    APPearance HAZY (*)    Leukocytes, UA TRACE (*)    All other components within normal limits  URINE MICROSCOPIC-ADD ON - Abnormal; Notable for the following:    Squamous Epithelial / LPF MANY (*)    Bacteria, UA MANY (*)    All other components within normal limits  HCG, QUANTITATIVE, PREGNANCY - Abnormal;  Notable for the following:    hCG, Beta Chain, Quant, [place] [id number] (*)    All other components within normal limits  URINALYSIS, ROUTINE W REFLEX MICROSCOPIC - Abnormal; Notable for the following:    Hgb urine dipstick TRACE (*)    All other components within normal limits  POC URINE PREG, ED - Abnormal; Notable for the following:    Preg Test, Ur POSITIVE (*)    All other components within normal limits  GC/CHLAMYDIA PROBE AMP  LIPASE, BLOOD  URINE MICROSCOPIC-ADD ON  RPR  HIV ANTIBODY (ROUTINE TESTING)    Imaging Review [place] Ob Comp Less 14 Wks  01/04/2014   CLINICAL DATA:  Abdominal pain; positive urine pregnancy test  EXAM: OBSTETRIC <14 WK US AND TRANSVAGINAL OB US  TECHNIQUE: Both transabdominal and transvaginal ultrasound examinations were performed for complete evaluation of the gestation as well as the maternal uterus, adnexal regions, and pelvic cul-de-sac. Transvaginal technique was performed  to assess early pregnancy.  COMPARISON:  None.  FINDINGS: Intrauterine gestational sac: Visualized/normal in shape.  Yolk sac:  Present  Embryo:  Present  Cardiac Activity: Present.  Heart Rate:  Could not be quantified.  CRL:   3  mm   5 w 6 d                  [place] EDC: August 31, 2014  Maternal uterus/adnexae: There is a small subchorionic hemorrhage. The right ovary is normal in size and echotexture. The left ovary measures 3.9 x 2.3 x 2.4 cm and contains a 2.4 cm diameter cyst compatible with a corpus luteum cyst of pregnancy.  IMPRESSION: 1. There is a very early IUP with estimated gestational age of [redacted] weeks 6 days. Cardiac activity is visible but cannot be quantified. 2. There is a small subchorionic hemorrhage. 3. The adnexal structures exhibit no acute abnormalities.   Electronically Signed   By: David  [place]   On: 01/04/2014 10:52   US Ob Transvaginal  01/04/2014   CLINICAL DATA:  Abdominal pain; positive urine pregnancy test  EXAM: OBSTETRIC <14 WK [place] AND TRANSVAGINAL OB US  TECHNIQUE: Both  transabdominal and transvaginal ultrasound examinations were performed for complete evaluation of the gestation as well as the maternal uterus, adnexal regions, and pelvic cul-de-sac. Transvaginal technique was performed to assess early pregnancy.  COMPARISON:  None.  FINDINGS: Intrauterine gestational sac: Visualized/normal in shape.  Yolk sac:  Present  Embryo:  Present  Cardiac Activity: Present.  Heart Rate:  Could not be quantified.  CRL:   3  mm   5 w 6 d                  [place] EDC: August 31, 2014  Maternal uterus/adnexae: There is a small subchorionic hemorrhage. The right ovary is normal in size and echotexture. The left ovary measures 3.9 x 2.3 x 2.4 cm and contains a 2.4 cm diameter cyst compatible with a corpus luteum cyst of pregnancy.  IMPRESSION: 1. There is a very early IUP with estimated gestational age of [redacted] weeks 6 days. Cardiac activity is visible but cannot be quantified. 2. There is a small subchorionic hemorrhage. 3. The adnexal structures exhibit no acute abnormalities.   Electronically Signed   By: David  [place]   On: 01/04/2014 10:52     EKG Interpretation None      MDM   Final diagnoses:  Pregnancy  Abdominal pain  BV (bacterial vaginosis)    BP 121/75 mmHg  Pulse 78  Temp(Src) 98 F (36.7 C) (Oral)  Resp 17  Ht 5\' 6"  (1.676 m)  Wt 150 lb (68.04 kg)  BMI 24.22 kg/m2  SpO2 100%  I have reviewed nursing notes and vital signs. I personally reviewed the imaging tests through PACS system  I reviewed available ER/hospitalization records thought the EMR     Fayrene Helper, PA-C 01/04/14 1122  Doug Sou, MD 01/04/14 1714

## 2014-01-04 NOTE — ED Notes (Signed)
Pt is still in ultrasound at this time per Denny PeonErin, [place]

## 2014-01-05 LAB — GC/CHLAMYDIA PROBE AMP
CT Probe RNA: NEGATIVE
GC Probe RNA: NEGATIVE

## 2014-01-17 ENCOUNTER — Emergency Department (HOSPITAL_COMMUNITY): Payer: Medicaid Other

## 2014-01-17 ENCOUNTER — Emergency Department (HOSPITAL_COMMUNITY)
Admission: EM | Admit: 2014-01-17 | Discharge: 2014-01-17 | Disposition: A | Discharge status: Home / Self Care | Payer: Medicaid Other | Attending: Emergency Medicine | Admitting: Emergency Medicine

## 2014-01-17 ENCOUNTER — Encounter (HOSPITAL_COMMUNITY): Payer: Self-pay | Admitting: *Deleted

## 2014-01-17 DIAGNOSIS — F17200 Nicotine dependence, unspecified, uncomplicated: Secondary | ICD-10-CM | POA: Insufficient documentation

## 2014-01-17 DIAGNOSIS — Z3A01 Less than 8 weeks gestation of pregnancy: Secondary | ICD-10-CM | POA: Diagnosis not present

## 2014-01-17 DIAGNOSIS — O200 Threatened abortion: Secondary | ICD-10-CM | POA: Insufficient documentation

## 2014-01-17 DIAGNOSIS — Z79899 Other long term (current) drug therapy: Secondary | ICD-10-CM | POA: Insufficient documentation

## 2014-01-17 DIAGNOSIS — O99331 Smoking (tobacco) complicating pregnancy, first trimester: Secondary | ICD-10-CM | POA: Insufficient documentation

## 2014-01-17 DIAGNOSIS — O210 Mild hyperemesis gravidarum: Secondary | ICD-10-CM | POA: Insufficient documentation

## 2014-01-17 DIAGNOSIS — O209 Hemorrhage in early pregnancy, unspecified: Secondary | ICD-10-CM | POA: Diagnosis present

## 2014-01-17 LAB — URINALYSIS, ROUTINE W REFLEX MICROSCOPIC
Bilirubin Urine: NEGATIVE
GLUCOSE, UA: 100 mg/dL — AB
Hgb urine dipstick: NEGATIVE
KETONES UR: 15 mg/dL — AB
Leukocytes, UA: NEGATIVE
Nitrite: NEGATIVE
PROTEIN: NEGATIVE mg/dL
Specific Gravity, Urine: 1.036 — ABNORMAL HIGH (ref 1.005–1.030)
Urobilinogen, UA: 1 mg/dL (ref 0.0–1.0)
pH: 7 (ref 5.0–8.0)

## 2014-01-17 LAB — I-STAT CHEM 8, ED
BUN: 7 mg/dL (ref 6–23)
CALCIUM ION: 1.23 mmol/L (ref 1.12–1.23)
CHLORIDE: 102 meq/L (ref 96–112)
Creatinine, Ser: 0.5 mg/dL (ref 0.50–1.10)
Glucose, Bld: 86 mg/dL (ref 70–99)
HCT: 35 % — ABNORMAL LOW (ref 36.0–46.0)
Hemoglobin: 11.9 g/dL — ABNORMAL LOW (ref 12.0–15.0)
Potassium: 3.8 mEq/L (ref 3.7–5.3)
Sodium: 136 mEq/L — ABNORMAL LOW (ref 137–147)
TCO2: 20 mmol/L (ref 0–100)

## 2014-01-17 LAB — WET PREP, GENITAL
Trich, Wet Prep: NONE SEEN
Yeast Wet Prep HPF POC: NONE SEEN

## 2014-01-17 LAB — POC URINE PREG, ED: PREG TEST UR: POSITIVE — AB

## 2014-01-17 MED ORDER — ONDANSETRON 4 MG PO TBDP
4.0000 mg | ORAL_TABLET | Freq: Once | ORAL | Status: AC
  Administered 2014-01-17: 4 mg via ORAL
  Filled 2014-01-17: qty 1

## 2014-01-17 MED ORDER — METRONIDAZOLE 500 MG PO TABS
2000.0000 mg | ORAL_TABLET | Freq: Once | ORAL | Status: AC
  Administered 2014-01-17: 2000 mg via ORAL
  Filled 2014-01-17: qty 4

## 2014-01-17 NOTE — ED Notes (Signed)
Pt reports being approx [redacted] weeks pregnant. Was taking a shower and noticed vaginal bleeding, was followed by n/v. No acute distress noted at triage.

## 2014-01-17 NOTE — Discharge Instructions (Signed)

## 2014-01-17 NOTE — ED Notes (Addendum)
Pt is approximately [redacted] weeks pregnant and states she noticed blood coming down her leg while in shower this AM.  Pt states she has had a miscarriage in 2011.  She was unable to specify amount of blood.  Pt c/o of cramps in lower right abdomen.  Pt stated she became sweaty and felt nauseated so she layed down on the floor and began to shake.  Pt called her fiance to help her get dressed and come to the ED.  Pt is not in distress at this time.

## 2014-01-17 NOTE — ED Provider Notes (Signed)
CSN: [id number]     Arrival date & time 01/17/14  1431 History   First MD Initiated Contact with Patient 01/17/14 1547     Chief Complaint  Patient presents with  . Vaginal Bleeding  . Emesis     (Consider location/radiation/quality/duration/timing/severity/associated sxs/prior Treatment) HPI   Patient presents to the ER with complaints of vaginal bleeding. She is approx 7-[redacted] weeks pregnant. She has not been having any pain or cramping. She was in the shower when she noticed some blood going down the drain. She is unsure of how much blood she passed. She had a brief episode of nausea and vomiting. She is high risk, she lost twins at 32 weeks, the second pregnancy was successful and now this is her 3rd. She was diagnosed with BV two weeks ago but did not get her abx meds filled because they are too expensive. She did get the nausea medication and prenatals filled. She denies her discharged being worse. At this time, no more bleeding, nausea or vomiting.  Past Medical History  Diagnosis Date  . No pertinent past medical history    Past Surgical History  Procedure Laterality Date  . No past surgeries    . Cesarean section  09/30/2011    Procedure: CESAREAN SECTION;  Surgeon: Antionette Char, MD;  Location: [facility];  Service: Gynecology;  Laterality: N/A;   Family History  Problem Relation Age of Onset  . Hypertension Mother   . Diabetes Father   . Hypertension Father    History  Substance Use Topics  . Smoking status: Current Every Day Smoker    Last Attempt to Quit: 02/02/2011  . Smokeless tobacco: Never Used  . Alcohol Use: Yes   OB History    Gravida Para Term Preterm AB TAB SAB Ectopic Multiple Living   3 2 1 1  0 0 0 0 1 1     Review of Systems  10 Systems reviewed and are negative for acute change except as noted in the HPI.     Allergies  Review of patient's allergies indicates no known allergies.  Home Medications   Prior to Admission medications    Medication Sig Start Date End Date Taking? Authorizing Provider  loratadine (CLARITIN) 10 MG tablet Take 10 mg by mouth daily as needed. allergies   Yes Historical Provider, MD  metoCLOPramide (REGLAN) 10 MG tablet Take 1 tablet (10 mg total) by mouth every 8 (eight) hours as needed for nausea or vomiting. 01/04/14  Yes Fayrene Helper, PA-C  Prenatal Vit-Fe Fumarate-FA (PRENATAL MULTIVITAMIN) TABS tablet Take 1 tablet by mouth at bedtime. 01/04/14  Yes Fayrene Helper, PA-C   BP 107/62 mmHg  Pulse 73  Temp(Src) 98.3 F (36.8 C) (Oral)  Resp 16  Ht 5\' 5"  (1.651 m)  Wt 149 lb (67.586 kg)  BMI 24.79 kg/m2  SpO2 100%  LMP 11/25/2013 Physical Exam  Constitutional: She appears well-developed and well-nourished. No distress.  HENT:  Head: Normocephalic and atraumatic.  Eyes: Pupils are equal, round, and reactive to light.  Neck: Normal range of motion. Neck supple.  Cardiovascular: Normal rate and regular rhythm.   Pulmonary/Chest: Effort normal.  Abdominal: Soft. Bowel sounds are normal. She exhibits no distension. There is no tenderness. There is no rigidity, no rebound and no guarding.  Genitourinary: Uterus normal. Cervix exhibits no motion tenderness, no discharge and no friability. Right adnexum displays no mass, no tenderness and no fullness. Left adnexum displays no mass, no tenderness and no fullness. No erythema, tenderness  or bleeding in the vagina. No foreign body around the vagina. No signs of injury around the vagina. Vaginal discharge (thick white discharge) found.  Neurological: She is alert.  Skin: Skin is warm and dry.  Nursing note and vitals reviewed.   ED Course  Procedures (including critical care time) Labs Review Labs Reviewed  URINALYSIS, ROUTINE W REFLEX MICROSCOPIC - Abnormal; Notable for the following:    Color, Urine AMBER (*)    Specific Gravity, Urine 1.036 (*)    Glucose, UA 100 (*)    Ketones, ur 15 (*)    All other components within normal limits  POC URINE  PREG, ED - Abnormal; Notable for the following:    Preg Test, Ur POSITIVE (*)    All other components within normal limits  I-STAT CHEM 8, ED - Abnormal; Notable for the following:    Sodium 136 (*)    Hemoglobin 11.9 (*)    HCT 35.0 (*)    All other components within normal limits  GC/CHLAMYDIA PROBE AMP  WET PREP, GENITAL    Imaging Review [place] Ob Comp Less 14 Wks  01/17/2014   CLINICAL DATA:  Vaginal bleeding before [redacted] weeks gestation, history smoking ; LMP 11/25/2013, EGA by LMP 7 weeks 4 days, EGA by prior ultrasound 7 weeks 1 day  EXAM: OBSTETRIC <14 WK US AND TRANSVAGINAL OB US  TECHNIQUE: Both transabdominal and transvaginal ultrasound examinations were performed for complete evaluation of the gestation as well as the maternal uterus, adnexal regions, and pelvic cul-de-sac. Transvaginal technique was performed to assess early pregnancy.  COMPARISON:  01/04/2014  FINDINGS: Intrauterine gestational sac: Visualized/normal in shape.  Yolk sac:  Present  Embryo:  Present  Cardiac Activity: Present  Heart Rate:  168 bpm  CRL:   15.0  mm   7 w 6 d                  US EDC: 08/29/2013  Maternal uterus/adnexae:  No definite subchorionic hemorrhage.  RIGHT ovary normal size and morphology 3.0 x 1.8 x 1.7 cm.  LEFT ovary normal size and morphology 4.8 x 3.2 x 3.3 cm.  No adnexal masses.  Trace free pelvic fluid.  IMPRESSION: Single live intrauterine gestation as above demonstrating interval growth since the previous study.  Small amount of nonspecific free pelvic fluid.  No additional abnormalities.   Electronically Signed   By: Ulyses SouthwardMark  Boles M.D.   On: 01/17/2014 18:31   [place] Ob Transvaginal  01/17/2014   CLINICAL DATA:  Vaginal bleeding before [redacted] weeks gestation, history smoking ; LMP 11/25/2013, EGA by LMP 7 weeks 4 days, EGA by prior ultrasound 7 weeks 1 day  EXAM: OBSTETRIC <14 WK US AND TRANSVAGINAL OB US  TECHNIQUE: Both transabdominal and transvaginal ultrasound examinations were performed for  complete evaluation of the gestation as well as the maternal uterus, adnexal regions, and pelvic cul-de-sac. Transvaginal technique was performed to assess early pregnancy.  COMPARISON:  01/04/2014  FINDINGS: Intrauterine gestational sac: Visualized/normal in shape.  Yolk sac:  Present  Embryo:  Present  Cardiac Activity: Present  Heart Rate:  168 bpm  CRL:   15.0  mm   7 w 6 d                  US EDC: 08/29/2013  Maternal uterus/adnexae:  No definite subchorionic hemorrhage.  RIGHT ovary normal size and morphology 3.0 x 1.8 x 1.7 cm.  LEFT ovary normal size and morphology 4.8 x 3.2 x  3.3 cm.  No adnexal masses.  Trace free pelvic fluid.  IMPRESSION: Single live intrauterine gestation as above demonstrating interval growth since the previous study.  Small amount of nonspecific free pelvic fluid.  No additional abnormalities.   Electronically Signed   By: Ulyses SouthwardMark  Boles M.D.   On: 01/17/2014 18:31     EKG Interpretation None      MDM   Final diagnoses:  Vaginal bleeding before [redacted] weeks gestation    The patient is no longer actively bleeding. Her hemoglobin is stable. NOt having any pain. Vital signs are stable.  US shows IUP. Pt is still at risk for threatened abortion. Pelvic showed no site of bleeding WET prep showed MANY clue cells Treated with 1 dose of 2 mg PO Flagyl. Patient had MANY WBC during last pelvic two weeks ago as well and her GC tests came back negative. Will wait for culture reports to return before treating.  28 y.o.Shari Hardin's evaluation in the Emergency Department is complete. It has been determined that no acute conditions requiring further emergency intervention are present at this time. The patient/guardian have been advised of the diagnosis and plan. We have discussed signs and symptoms that warrant return to the ED, such as changes or worsening in symptoms.  Vital signs are stable at discharge. Filed Vitals:   01/17/14 1708  BP: 107/62  Pulse:   Temp:    Resp: 16    Patient/guardian has voiced understanding and agreed to follow-up with the PCP or specialist.   Dorthula Matasiffany G Geanine Vandekamp, PA-C 01/17/14 1856  Dorthula Matasiffany G Tessie Ordaz, PA-C 01/17/14 1921  Nelia Shiobert L Beaton, MD 01/24/14 2145

## 2014-01-17 NOTE — ED Notes (Signed)
US ready. Nurse tech going as chaperone.

## 2014-01-18 LAB — GC/CHLAMYDIA PROBE AMP
CT PROBE, AMP APTIMA: NEGATIVE
GC Probe RNA: NEGATIVE

## 2014-02-09 ENCOUNTER — Telehealth: Payer: Self-pay | Admitting: *Deleted

## 2014-02-09 NOTE — Telephone Encounter (Signed)
Patient is an established patient in our office and is currently pregnant and in need of a NOB appointment. Scheduled patient an appointment for 02-14-14 @ 10:30

## 2014-02-14 ENCOUNTER — Encounter: Payer: Self-pay | Admitting: Obstetrics

## 2014-02-14 ENCOUNTER — Telehealth: Payer: Self-pay | Admitting: *Deleted

## 2014-02-14 NOTE — Telephone Encounter (Signed)
Patient called stating she missed her NOB appointment this morning. Patient requesting to schedule an appointment.  Attempted to contact the patient and left message to notify patiet per office policy we are unable to reschedule her appointment due to her No call No show.

## 2014-02-27 ENCOUNTER — Encounter: Payer: Self-pay | Admitting: Obstetrics & Gynecology

## 2014-03-15 ENCOUNTER — Encounter (HOSPITAL_COMMUNITY): Payer: Self-pay | Admitting: Obstetrics & Gynecology

## 2014-07-15 ENCOUNTER — Inpatient Hospital Stay (HOSPITAL_COMMUNITY)
Admit: 2014-07-15 | Discharge: 2014-07-15 | Disposition: A | Discharge status: Home / Self Care | Payer: Medicaid Other | Source: Ambulatory Visit | Attending: Obstetrics and Gynecology | Admitting: Obstetrics and Gynecology

## 2014-07-15 ENCOUNTER — Encounter (HOSPITAL_COMMUNITY): Payer: Self-pay | Admitting: *Deleted

## 2014-07-15 DIAGNOSIS — O09899 Supervision of other high risk pregnancies, unspecified trimester: Secondary | ICD-10-CM

## 2014-07-15 DIAGNOSIS — Z283 Underimmunization status: Secondary | ICD-10-CM

## 2014-07-15 DIAGNOSIS — R0689 Other abnormalities of breathing: Secondary | ICD-10-CM

## 2014-07-15 DIAGNOSIS — R0600 Dyspnea, unspecified: Secondary | ICD-10-CM | POA: Insufficient documentation

## 2014-07-15 DIAGNOSIS — O99343 Other mental disorders complicating pregnancy, third trimester: Secondary | ICD-10-CM | POA: Diagnosis not present

## 2014-07-15 DIAGNOSIS — N760 Acute vaginitis: Secondary | ICD-10-CM | POA: Diagnosis not present

## 2014-07-15 DIAGNOSIS — O98813 Other maternal infectious and parasitic diseases complicating pregnancy, third trimester: Secondary | ICD-10-CM | POA: Insufficient documentation

## 2014-07-15 DIAGNOSIS — O23593 Infection of other part of genital tract in pregnancy, third trimester: Secondary | ICD-10-CM | POA: Diagnosis not present

## 2014-07-15 DIAGNOSIS — R824 Acetonuria: Secondary | ICD-10-CM | POA: Insufficient documentation

## 2014-07-15 DIAGNOSIS — Z87891 Personal history of nicotine dependence: Secondary | ICD-10-CM | POA: Diagnosis not present

## 2014-07-15 DIAGNOSIS — O479 False labor, unspecified: Secondary | ICD-10-CM

## 2014-07-15 DIAGNOSIS — B9689 Other specified bacterial agents as the cause of diseases classified elsewhere: Secondary | ICD-10-CM | POA: Diagnosis not present

## 2014-07-15 DIAGNOSIS — O9989 Other specified diseases and conditions complicating pregnancy, childbirth and the puerperium: Secondary | ICD-10-CM

## 2014-07-15 DIAGNOSIS — Z3A33 33 weeks gestation of pregnancy: Secondary | ICD-10-CM | POA: Diagnosis not present

## 2014-07-15 DIAGNOSIS — B373 Candidiasis of vulva and vagina: Secondary | ICD-10-CM | POA: Insufficient documentation

## 2014-07-15 DIAGNOSIS — F419 Anxiety disorder, unspecified: Secondary | ICD-10-CM | POA: Diagnosis not present

## 2014-07-15 DIAGNOSIS — O09299 Supervision of pregnancy with other poor reproductive or obstetric history, unspecified trimester: Secondary | ICD-10-CM

## 2014-07-15 DIAGNOSIS — Z6831 Body mass index (BMI) 31.0-31.9, adult: Secondary | ICD-10-CM

## 2014-07-15 DIAGNOSIS — O4700 False labor before 37 completed weeks of gestation, unspecified trimester: Secondary | ICD-10-CM

## 2014-07-15 DIAGNOSIS — O34219 Maternal care for unspecified type scar from previous cesarean delivery: Secondary | ICD-10-CM

## 2014-07-15 DIAGNOSIS — Z98891 History of uterine scar from previous surgery: Secondary | ICD-10-CM | POA: Diagnosis not present

## 2014-07-15 LAB — URINALYSIS, ROUTINE W REFLEX MICROSCOPIC
BILIRUBIN URINE: NEGATIVE
GLUCOSE, UA: NEGATIVE mg/dL
Hgb urine dipstick: NEGATIVE
Ketones, ur: 15 mg/dL — AB
Nitrite: NEGATIVE
PH: 8 (ref 5.0–8.0)
Protein, ur: NEGATIVE mg/dL
SPECIFIC GRAVITY, URINE: 1.02 (ref 1.005–1.030)
UROBILINOGEN UA: 1 mg/dL (ref 0.0–1.0)

## 2014-07-15 LAB — URINE MICROSCOPIC-ADD ON

## 2014-07-15 LAB — WET PREP, GENITAL: TRICH WET PREP: NONE SEEN

## 2014-07-15 MED ORDER — CLOTRIMAZOLE 1 % VA CREA: 1.0000 | TOPICAL_CREAM | Freq: Every day | VAGINAL | Status: AC

## 2014-07-15 MED ORDER — CLOTRIMAZOLE 2 % VA CREA: 1.0000 | TOPICAL_CREAM | Freq: Every day | VAGINAL | Status: DC

## 2014-07-15 MED ORDER — METRONIDAZOLE 500 MG PO TABS: 500.0000 mg | ORAL_TABLET | Freq: Two times a day (BID) | ORAL | Status: AC

## 2014-07-15 NOTE — Discharge Instructions (Signed)
Candida Infection A Candida infection (also called yeast, fungus, and Monilia infection) is an overgrowth of yeast that can occur anywhere on the body. A yeast infection commonly occurs in warm, moist body areas. Usually, the infection remains localized but can spread to become a systemic infection. A yeast infection may be a sign of a more severe disease such as diabetes, leukemia, or AIDS. A yeast infection can occur in both men and women. In women, Candida vaginitis is a vaginal infection. It is one of the most common causes of vaginitis. Men usually do not have symptoms or know they have an infection until other problems develop. Men may find out they have a yeast infection because their sex partner has a yeast infection. Uncircumcised men are more likely to get a yeast infection than circumcised men. This is because the uncircumcised glans is not exposed to air and does not remain as dry as that of a circumcised glans. Older adults may develop yeast infections around dentures. CAUSES  Women  Antibiotics.  Steroid medication taken for a long time.  Being overweight (obese). Diabetes.Bacterial Vaginosis Bacterial vaginosis is a vaginal infection that occurs when the normal balance of bacteria in the vagina is disrupted. It results from an overgrowth of certain bacteria. This is the most common vaginal infection in women of childbearing age. Treatment is important to prevent complications, especially in pregnant women, as it can cause a premature delivery. CAUSES  Bacterial vaginosis is caused by an increase in harmful bacteria that are normally present in smaller amounts in the vagina. Several different kinds of bacteria can cause bacterial vaginosis. However, the reason that the condition develops is not fully understood. RISK FACTORS Certain activities or behaviors can put you at an increased risk of developing bacterial vaginosis, including: Having a new sex partner or multiple sex  partners. Douching. Using an intrauterine device (IUD) for contraception. Women do not get bacterial vaginosis from toilet seats, bedding, swimming pools, or contact with objects around them. SIGNS AND SYMPTOMS  Some women with bacterial vaginosis have no signs or symptoms. Common symptoms include: Grey vaginal discharge. A fishlike odor with discharge, especially after sexual intercourse. Itching or burning of the vagina and vulva. Burning or pain with urination. DIAGNOSIS  Your health care provider will take a medical history and examine the vagina for signs of bacterial vaginosis. A sample of vaginal fluid may be taken. Your health care provider will look at this sample under a microscope to check for bacteria and abnormal cells. A vaginal pH test may also be done.  TREATMENT  Bacterial vaginosis may be treated with antibiotic medicines. These may be given in the form of a pill or a vaginal cream. A second round of antibiotics may be prescribed if the condition comes back after treatment.  HOME CARE INSTRUCTIONS  Only take over-the-counter or prescription medicines as directed by your health care provider. If antibiotic medicine was prescribed, take it as directed. Make sure you finish it even if you start to feel better. Do not have sex until treatment is completed. Tell all sexual partners that you have a vaginal infection. They should see their health care provider and be treated if they have problems, such as a mild rash or itching. Practice safe sex by using condoms and only having one sex partner. SEEK MEDICAL CARE IF:  Your symptoms are not improving after 3 days of treatment. You have increased discharge or pain. You have a fever. MAKE SURE YOU:  Understand these instructions.  Will watch your condition. Will get help right away if you are not doing well or get worse. FOR MORE INFORMATION  Centers for Disease Control and Prevention, Division of STD Prevention:  [employer] American Sexual Health Association (ASHA): www.ashastd.org  Document Released: 02/16/2005 Document Revised: 12/07/2012 Document Reviewed: 09/28/2012 [facility] Patient Information 2015 [place], [place]. This information is not intended to replace advice given to you by your health care provider. Make sure you discuss any questions you have with your health care provider.    Poor immune condition.  Certain serious medical conditions.  Immune suppressive medications for organ transplant patients.  Chemotherapy.  Pregnancy.  Menstruation.  Stress and fatigue.  Intravenous drug use.  Oral contraceptives.  Wearing tight-fitting clothes in the crotch area.  Catching it from a sex partner who has a yeast infection.  Spermicide.  Intravenous, urinary, or other catheters. Men  Catching it from a sex partner who has a yeast infection.  Having oral or anal sex with a person who has the infection.  Spermicide.  Diabetes.  Antibiotics.  Poor immune system.  Medications that suppress the immune system.  Intravenous drug use.  Intravenous, urinary, or other catheters. SYMPTOMS  Women  Thick, white vaginal discharge.  Vaginal itching.  Redness and swelling in and around the vagina.  Irritation of the lips of the vagina and perineum.  Blisters on the vaginal lips and perineum.  Painful sexual intercourse.  Low blood sugar (hypoglycemia).  Painful urination.  Bladder infections.  Intestinal problems such as constipation, indigestion, bad breath, bloating, increase in gas, diarrhea, or loose stools. Men  Men may develop intestinal problems such as constipation, indigestion, bad breath, bloating, increase in gas, diarrhea, or loose stools.  Dry, cracked skin on the penis with itching or discomfort.  Jock itch.  Dry, flaky skin.  Athlete's foot.  Hypoglycemia. DIAGNOSIS  Women  A history and an exam are performed.  The discharge may  be examined under a microscope.  A culture may be taken of the discharge. Men  A history and an exam are performed.  Any discharge from the penis or areas of cracked skin will be looked at under the microscope and cultured.  Stool samples may be cultured. TREATMENT  Women  Vaginal antifungal suppositories and creams.  Medicated creams to decrease irritation and itching on the outside of the vagina.  Warm compresses to the perineal area to decrease swelling and discomfort.  Oral antifungal medications.  Medicated vaginal suppositories or cream for repeated or recurrent infections.  Wash and dry the irritation areas before applying the cream.  Eating yogurt with Lactobacillus may help with prevention and treatment.  Sometimes painting the vagina with gentian violet solution may help if creams and suppositories do not work. Men  Antifungal creams and oral antifungal medications.  Sometimes treatment must continue for 30 days after the symptoms go away to prevent recurrence. HOME CARE INSTRUCTIONS  Women  Use cotton underwear and avoid tight-fitting clothing.  Avoid colored, scented toilet paper and deodorant tampons or pads.  Do not douche.  Keep your diabetes under control.  Finish all the prescribed medications.  Keep your skin clean and dry.  Consume milk or yogurt with Lactobacillus-active culture regularly. If you get frequent yeast infections and think that is what the infection is, there are over-the-counter medications that you can get. If the infection does not show healing in 3 days, talk to your caregiver.  Tell your sex partner you have a yeast infection. Your partner may  need treatment also, especially if your infection does not clear up or recurs. Men  Keep your skin clean and dry.  Keep your diabetes under control.  Finish all prescribed medications.  Tell your sex partner that you have a yeast infection so he or she can be treated if  necessary. SEEK MEDICAL CARE IF:   Your symptoms do not clear up or worsen in one week after treatment.  You have an oral temperature above 102 F (38.9 C).  You have trouble swallowing or eating for a prolonged time.  You develop blisters on and around your vagina.  You develop vaginal bleeding and it is not your menstrual period.  You develop abdominal pain.  You develop intestinal problems as mentioned above.  You get weak or light-headed.  You have painful or increased urination.  You have pain during sexual intercourse. MAKE SURE YOU:   Understand these instructions.  Will watch your condition.  Will get help right away if you are not doing well or get worse. Document Released: 03/26/2004 Document Revised: 07/03/2013 Document Reviewed: 07/08/2009 [facility] Patient Information 2015 [employer]. This information is not intended to replace advice given to you by your health care provider. Make sure you discuss any questions you have with your health care provider.

## 2014-07-15 NOTE — MAU Note (Signed)
Pt arrives via EMS, with complaint of right side pain x 30 minutes, denies vomiting. Denies vaginal bleeding or dysuria.

## 2014-07-15 NOTE — MAU Note (Signed)
Pt reports diarrhea today. Pt states that it started as a pain in her stomach, pt thought she may have to go to the bathroom. Pt had diarrhea. Pt felt like she couldn't breath, so she called EMS.

## 2014-07-15 NOTE — MAU Provider Note (Signed)
History  29 yo G3P1201 @ 33.1 wks by certain LMP on 11/25/13 presents unannounced to MAU via EMS w/ c/o new onset of difficulty breathing and right sided abdominal pain since ~ 8:30 PM tonight. Thought pain was related to need for BM, but pain did not subside after loose stool.  Gives h/o IUFD twins at 1133 wks on 05/31/2009 -- induced due to asymmetric growth restriction and [facility] [place] She subsequently underwent a c-section for FTP/FTD by Dr. Liam RogersL. Jackson Moore -- thrombophilia work-up was negative. Since arrival to MAU, reports resolution of pain, unlabored breathing and no urge to defecate.     +FM. No bleeding, unusual discharge/odor or regular ctxs.   Patient Active Problem List   Diagnosis Date Noted  . BMI 31.0-31.9,adult 07/16/2014  . H/O: C-section - FTP/FTD; 2 layer closure 07/15/2014  . Prior pregnancy with fetal demise, antepartum - twins 07/15/2014  . Desires VBAC (vaginal birth after cesarean) trial 07/15/2014  . Preterm contractions 07/15/2014  . Difficulty breathing 07/15/2014  . IUGR (intrauterine growth retardation) & stillbirth history, pregnant - asymmetric growth; normal thrombophilia work-up; possible chorio of Twin A and IUGR per path report 07/15/2014  . Rubella non-immune status, antepartum 07/15/2014    Chief Complaint  Patient presents with  . Diarrhea  . Abdominal Pain   HPI As above OB History    Gravida Para Term Preterm AB TAB SAB Ectopic Multiple Living   3 2 1 1  0 0 0 0 1 1      Past Medical History  Diagnosis Date  . No pertinent past medical history     Past Surgical History  Procedure Laterality Date  . No past surgeries    . Cesarean section      Family History  Problem Relation Age of Onset  . Hypertension Mother   . Diabetes Father   . Hypertension Father     History  Substance Use Topics  . Smoking status: Former Smoker    Quit date: 02/02/2011  . Smokeless tobacco: Never Used  .  Alcohol Use: Yes    Allergies: No Known Allergies  Prescriptions prior to admission  Medication Sig Dispense Refill Last Dose  . hydrocortisone cream 1 % Apply 1 application topically 2 (two) times daily as needed for itching (rash).   two weeks  . Prenatal Vit-Fe Fumarate-FA (PRENATAL MULTIVITAMIN) TABS tablet Take 1 tablet by mouth at bedtime. 30 tablet 1 07/14/2014 at Unknown time  . metoCLOPramide (REGLAN) 10 MG tablet Take 1 tablet (10 mg total) by mouth every 8 (eight) hours as needed for nausea or vomiting. (Patient not taking: Reported on 07/15/2014) 12 tablet 0 More than a month at Unknown time    ROS  As noted in HPI Physical Exam    Blood pressure 121/74, pulse 80, temperature 97.6 F (36.4 C), temperature source Oral, resp. rate 18, height 5\' 6"  (1.676 m), weight 87.544 kg (193 lb), last menstrual period 11/25/2013, unknown if currently breastfeeding.  Recent Results (from the past 2160 hour(s))  Urinalysis, Routine w reflex microscopic     Status: Abnormal   Collection Time: 07/15/14  9:09 PM  Result Value Ref Range   Color, Urine YELLOW YELLOW   APPearance CLEAR CLEAR   Specific Gravity, Urine 1.020 1.005 - 1.030   pH 8.0 5.0 - 8.0   Glucose, UA NEGATIVE NEGATIVE mg/dL   Hgb urine dipstick NEGATIVE NEGATIVE   Bilirubin Urine NEGATIVE NEGATIVE   Ketones, ur  15 (A) NEGATIVE mg/dL   Protein, ur NEGATIVE NEGATIVE mg/dL   Urobilinogen, UA 1.0 0.0 - 1.0 mg/dL   Nitrite NEGATIVE NEGATIVE   Leukocytes, UA SMALL (A) NEGATIVE  Urine microscopic-add on     Status: Abnormal   Collection Time: 07/15/14  9:09 PM  Result Value Ref Range   Squamous Epithelial / LPF RARE RARE   WBC, UA 3-6 <3 WBC/hpf   RBC / HPF 0-2 <3 RBC/hpf   Bacteria, UA MANY (A) RARE  OB Urine Culture     Status: None (Preliminary result)   Collection Time: 07/15/14  9:09 PM  Result Value Ref Range   Specimen Description URINE, RANDOM    Special Requests NONE    Colony Count      >=100,000  COLONIES/ML Performed at [facility]    Culture      GRAM NEGATIVE RODS Note: NO GROUP B STREP (S.AGALACTIAE) ISOLATED                                                              Culture based screening of vaginal/anorectal swabs at  35 to [redacted] weeks gestation is required to rule out the carriage of Group B Streptococcus. Performed at [facility]    Report Status PENDING   Wet prep, genital     Status: Abnormal   Collection Time: 07/15/14 11:12 PM  Result Value Ref Range   Yeast Wet Prep HPF POC MODERATE (A) NONE SEEN   Trich, Wet Prep NONE SEEN NONE SEEN   Clue Cells Wet Prep HPF POC FEW (A) NONE SEEN   WBC, Wet Prep HPF POC MANY (A) NONE SEEN    Comment: MANY BACTERIA SEEN    Physical Exam Gen: NAD Lungs: CTAB - unlabored breathing CV: RRR w/o murmur SP02: 96-98% on RA Abdomen: gravid, soft, NT, no guarding or rebound tenderness Pelvic: Consented to cervical exam only. Cvx closed/thick/high. Moderate amount of yellow cottage cheese noted on examining glove. No odor. Sample to lab. Ext: WNL  FHRT: BL 135 w/ moderate variability, +accels, no decels U/C's: Occasional, mostly irritability ED Course  UA Urine culture Wet prep  Assessment: IUP at 33.1 wks Anxiety episode BV Yeast Ketonuria - did not eat/drink much today   Plan: Reassurance. Metronidazole.  Clotrimazole. [place] Counseled on medication use. Advised importance of taking meds as prescribed. No intercourse during treatment. F/U w/ new OB provider as scheduled. Travel precautions. Strict kick counts. PTL precautions.   Sherre ScarletWILLIAMS, Halyn Flaugher CNM, MS 07/16/2014 12:24 AM

## 2014-07-16 DIAGNOSIS — Z6831 Body mass index (BMI) 31.0-31.9, adult: Secondary | ICD-10-CM

## 2014-07-18 LAB — CULTURE, OB URINE

## 2014-07-19 ENCOUNTER — Telehealth: Payer: Self-pay | Admitting: Certified Nurse Midwife

## 2014-07-19 DIAGNOSIS — N390 Urinary tract infection, site not specified: Secondary | ICD-10-CM

## 2014-07-19 MED ORDER — AMPICILLIN 500 MG PO CAPS: 500.0000 mg | ORAL_CAPSULE | Freq: Four times a day (QID) | ORAL | Status: AC

## 2014-07-19 NOTE — Telephone Encounter (Signed)
Tried both phone numbers on record and both were non working numbers. Faxed Ampicillin 500mg  qid x 7 days to her pharmacy. Unable to leave message

## 2014-08-09 ENCOUNTER — Encounter (HOSPITAL_COMMUNITY): Payer: Self-pay | Admitting: Obstetrics & Gynecology

## 2015-05-20 ENCOUNTER — Encounter (HOSPITAL_COMMUNITY): Payer: Self-pay | Admitting: *Deleted
